# Patient Record
Sex: Female | Born: 1940
Health system: Southern US, Community
[De-identification: ages and names within clinical notes are randomized; demographics above are authoritative.]

## PROBLEM LIST (undated history)

## (undated) DIAGNOSIS — G4700 Insomnia, unspecified: Secondary | ICD-10-CM

## (undated) DIAGNOSIS — C50411 Malignant neoplasm of upper-outer quadrant of right female breast: Secondary | ICD-10-CM

## (undated) DIAGNOSIS — I5181 Takotsubo syndrome: Secondary | ICD-10-CM

## (undated) DIAGNOSIS — I219 Acute myocardial infarction, unspecified: Secondary | ICD-10-CM

## (undated) DIAGNOSIS — N952 Postmenopausal atrophic vaginitis: Secondary | ICD-10-CM

## (undated) DIAGNOSIS — K5790 Diverticulosis of intestine, part unspecified, without perforation or abscess without bleeding: Secondary | ICD-10-CM

## (undated) DIAGNOSIS — Z87442 Personal history of urinary calculi: Secondary | ICD-10-CM

## (undated) DIAGNOSIS — I10 Essential (primary) hypertension: Secondary | ICD-10-CM

## (undated) DIAGNOSIS — A029 Salmonella infection, unspecified: Secondary | ICD-10-CM

## (undated) DIAGNOSIS — C801 Malignant (primary) neoplasm, unspecified: Secondary | ICD-10-CM

## (undated) DIAGNOSIS — I249 Acute ischemic heart disease, unspecified: Secondary | ICD-10-CM

## (undated) HISTORY — DX: Postmenopausal atrophic vaginitis: N95.2

## (undated) HISTORY — DX: Takotsubo syndrome: I51.81

## (undated) HISTORY — DX: Malignant neoplasm of upper-outer quadrant of right female breast: C50.411

## (undated) HISTORY — DX: Diverticulosis of intestine, part unspecified, without perforation or abscess without bleeding: K57.90

## (undated) HISTORY — DX: Salmonella infection, unspecified: A02.9

## (undated) HISTORY — PX: COLONOSCOPY: SHX174

## (undated) HISTORY — PX: POLYPECTOMY: SHX149

## (undated) HISTORY — DX: Insomnia, unspecified: G47.00

## (undated) HISTORY — DX: Acute ischemic heart disease, unspecified: I24.9

## (undated) HISTORY — DX: Malignant (primary) neoplasm, unspecified: C80.1

---

## 1988-08-22 HISTORY — PX: DILATION AND CURETTAGE OF UTERUS: SHX78

## 1991-08-23 HISTORY — PX: CHOLECYSTECTOMY: SHX55

## 1998-03-17 ENCOUNTER — Other Ambulatory Visit: Admission: RE | Admit: 1998-03-17 | Discharge: 1998-03-17 | Payer: Self-pay | Admitting: Obstetrics and Gynecology

## 1998-06-23 ENCOUNTER — Other Ambulatory Visit: Admission: RE | Admit: 1998-06-23 | Discharge: 1998-06-23 | Payer: Self-pay | Admitting: Obstetrics and Gynecology

## 1998-07-22 ENCOUNTER — Ambulatory Visit (HOSPITAL_COMMUNITY): Admission: RE | Admit: 1998-07-22 | Discharge: 1998-07-22 | Payer: Self-pay | Admitting: Obstetrics and Gynecology

## 1998-07-22 ENCOUNTER — Encounter: Payer: Self-pay | Admitting: Obstetrics and Gynecology

## 1999-07-14 ENCOUNTER — Other Ambulatory Visit: Admission: RE | Admit: 1999-07-14 | Discharge: 1999-07-14 | Payer: Self-pay | Admitting: Obstetrics and Gynecology

## 1999-08-26 ENCOUNTER — Ambulatory Visit (HOSPITAL_COMMUNITY): Admission: RE | Admit: 1999-08-26 | Discharge: 1999-08-26 | Payer: Self-pay | Admitting: Obstetrics and Gynecology

## 1999-08-26 ENCOUNTER — Encounter: Payer: Self-pay | Admitting: Obstetrics and Gynecology

## 2000-07-18 ENCOUNTER — Other Ambulatory Visit: Admission: RE | Admit: 2000-07-18 | Discharge: 2000-07-18 | Payer: Self-pay | Admitting: Obstetrics and Gynecology

## 2000-08-28 ENCOUNTER — Encounter: Admission: RE | Admit: 2000-08-28 | Discharge: 2000-08-28 | Payer: Self-pay | Admitting: Obstetrics and Gynecology

## 2000-08-28 ENCOUNTER — Encounter: Payer: Self-pay | Admitting: Obstetrics and Gynecology

## 2001-05-08 ENCOUNTER — Ambulatory Visit (HOSPITAL_COMMUNITY): Admission: RE | Admit: 2001-05-08 | Discharge: 2001-05-08 | Payer: Self-pay | Admitting: Internal Medicine

## 2001-05-08 ENCOUNTER — Encounter: Payer: Self-pay | Admitting: Internal Medicine

## 2001-09-06 ENCOUNTER — Other Ambulatory Visit: Admission: RE | Admit: 2001-09-06 | Discharge: 2001-09-06 | Payer: Self-pay | Admitting: Obstetrics and Gynecology

## 2001-09-18 ENCOUNTER — Encounter: Admission: RE | Admit: 2001-09-18 | Discharge: 2001-09-18 | Payer: Self-pay | Admitting: Obstetrics and Gynecology

## 2001-09-18 ENCOUNTER — Encounter: Payer: Self-pay | Admitting: Obstetrics and Gynecology

## 2002-03-14 ENCOUNTER — Emergency Department (HOSPITAL_COMMUNITY): Admission: EM | Admit: 2002-03-14 | Discharge: 2002-03-14 | Payer: Self-pay | Admitting: Emergency Medicine

## 2002-03-17 ENCOUNTER — Emergency Department (HOSPITAL_COMMUNITY): Admission: EM | Admit: 2002-03-17 | Discharge: 2002-03-17 | Payer: Self-pay | Admitting: *Deleted

## 2002-03-21 ENCOUNTER — Encounter (HOSPITAL_COMMUNITY): Admission: RE | Admit: 2002-03-21 | Discharge: 2002-06-19 | Payer: Self-pay | Admitting: *Deleted

## 2002-09-23 ENCOUNTER — Encounter: Admission: RE | Admit: 2002-09-23 | Discharge: 2002-09-23 | Payer: Self-pay | Admitting: Obstetrics and Gynecology

## 2002-09-23 ENCOUNTER — Encounter: Payer: Self-pay | Admitting: Obstetrics and Gynecology

## 2002-10-14 ENCOUNTER — Other Ambulatory Visit: Admission: RE | Admit: 2002-10-14 | Discharge: 2002-10-14 | Payer: Self-pay | Admitting: Obstetrics and Gynecology

## 2003-08-23 HISTORY — PX: OTHER SURGICAL HISTORY: SHX169

## 2003-10-07 ENCOUNTER — Encounter: Admission: RE | Admit: 2003-10-07 | Discharge: 2003-10-07 | Payer: Self-pay | Admitting: Obstetrics and Gynecology

## 2003-10-28 ENCOUNTER — Other Ambulatory Visit: Admission: RE | Admit: 2003-10-28 | Discharge: 2003-10-28 | Payer: Self-pay | Admitting: Obstetrics and Gynecology

## 2004-10-19 ENCOUNTER — Encounter: Admission: RE | Admit: 2004-10-19 | Discharge: 2004-10-19 | Payer: Self-pay | Admitting: Obstetrics and Gynecology

## 2004-11-02 ENCOUNTER — Other Ambulatory Visit: Admission: RE | Admit: 2004-11-02 | Discharge: 2004-11-02 | Payer: Self-pay | Admitting: Obstetrics and Gynecology

## 2005-10-20 ENCOUNTER — Encounter: Admission: RE | Admit: 2005-10-20 | Discharge: 2005-10-20 | Payer: Self-pay | Admitting: Obstetrics and Gynecology

## 2005-12-07 ENCOUNTER — Other Ambulatory Visit: Admission: RE | Admit: 2005-12-07 | Discharge: 2005-12-07 | Payer: Self-pay | Admitting: Obstetrics and Gynecology

## 2006-12-13 ENCOUNTER — Other Ambulatory Visit: Admission: RE | Admit: 2006-12-13 | Discharge: 2006-12-13 | Payer: Self-pay | Admitting: Obstetrics and Gynecology

## 2007-01-04 ENCOUNTER — Ambulatory Visit: Payer: Self-pay | Admitting: Internal Medicine

## 2007-01-04 DIAGNOSIS — E785 Hyperlipidemia, unspecified: Secondary | ICD-10-CM | POA: Insufficient documentation

## 2007-09-25 ENCOUNTER — Ambulatory Visit: Payer: Self-pay | Admitting: Internal Medicine

## 2007-10-09 ENCOUNTER — Encounter: Payer: Self-pay | Admitting: Internal Medicine

## 2007-10-09 ENCOUNTER — Ambulatory Visit: Payer: Self-pay | Admitting: Internal Medicine

## 2007-11-21 ENCOUNTER — Telehealth: Payer: Self-pay | Admitting: Internal Medicine

## 2007-11-26 ENCOUNTER — Encounter: Payer: Self-pay | Admitting: Internal Medicine

## 2007-12-27 ENCOUNTER — Other Ambulatory Visit: Admission: RE | Admit: 2007-12-27 | Discharge: 2007-12-27 | Payer: Self-pay | Admitting: Obstetrics and Gynecology

## 2007-12-28 LAB — CONVERTED CEMR LAB
HDL: 56 mg/dL
LDL Cholesterol: 164 mg/dL
Triglycerides: 142 mg/dL

## 2008-01-30 ENCOUNTER — Ambulatory Visit: Payer: Self-pay | Admitting: Internal Medicine

## 2008-04-29 ENCOUNTER — Encounter: Payer: Self-pay | Admitting: Internal Medicine

## 2008-08-22 HISTORY — PX: KIDNEY STONE SURGERY: SHX686

## 2008-08-26 ENCOUNTER — Encounter: Payer: Self-pay | Admitting: Internal Medicine

## 2008-12-09 ENCOUNTER — Encounter: Payer: Self-pay | Admitting: Internal Medicine

## 2008-12-30 ENCOUNTER — Encounter: Payer: Self-pay | Admitting: Obstetrics and Gynecology

## 2008-12-30 ENCOUNTER — Ambulatory Visit: Payer: Self-pay | Admitting: Obstetrics and Gynecology

## 2008-12-30 ENCOUNTER — Other Ambulatory Visit: Admission: RE | Admit: 2008-12-30 | Discharge: 2008-12-30 | Payer: Self-pay | Admitting: Obstetrics and Gynecology

## 2008-12-30 LAB — CONVERTED CEMR LAB
HDL: 68 mg/dL
LDL Cholesterol: 169 mg/dL

## 2009-01-28 ENCOUNTER — Ambulatory Visit: Payer: Self-pay | Admitting: Internal Medicine

## 2009-03-11 ENCOUNTER — Encounter: Payer: Self-pay | Admitting: Internal Medicine

## 2009-03-11 ENCOUNTER — Emergency Department (HOSPITAL_COMMUNITY): Admission: EM | Admit: 2009-03-11 | Discharge: 2009-03-11 | Payer: Self-pay | Admitting: Emergency Medicine

## 2009-03-27 ENCOUNTER — Encounter: Payer: Self-pay | Admitting: Internal Medicine

## 2009-04-13 ENCOUNTER — Encounter: Payer: Self-pay | Admitting: Internal Medicine

## 2009-04-24 ENCOUNTER — Emergency Department (HOSPITAL_COMMUNITY): Admission: EM | Admit: 2009-04-24 | Discharge: 2009-04-25 | Payer: Self-pay | Admitting: Emergency Medicine

## 2009-05-05 ENCOUNTER — Encounter: Payer: Self-pay | Admitting: Internal Medicine

## 2009-05-12 ENCOUNTER — Encounter: Payer: Self-pay | Admitting: Internal Medicine

## 2009-06-16 ENCOUNTER — Encounter: Payer: Self-pay | Admitting: Internal Medicine

## 2009-06-24 ENCOUNTER — Ambulatory Visit (HOSPITAL_BASED_OUTPATIENT_CLINIC_OR_DEPARTMENT_OTHER): Admission: RE | Admit: 2009-06-24 | Discharge: 2009-06-24 | Payer: Self-pay | Admitting: Urology

## 2009-07-10 ENCOUNTER — Encounter (INDEPENDENT_AMBULATORY_CARE_PROVIDER_SITE_OTHER): Payer: Self-pay | Admitting: *Deleted

## 2009-12-15 ENCOUNTER — Encounter: Payer: Self-pay | Admitting: Internal Medicine

## 2010-01-12 ENCOUNTER — Other Ambulatory Visit: Admission: RE | Admit: 2010-01-12 | Discharge: 2010-01-12 | Payer: Self-pay | Admitting: Obstetrics and Gynecology

## 2010-01-12 ENCOUNTER — Ambulatory Visit: Payer: Self-pay | Admitting: Obstetrics and Gynecology

## 2010-01-13 LAB — CONVERTED CEMR LAB
HDL: 56 mg/dL
Triglycerides: 89 mg/dL

## 2010-01-20 LAB — CONVERTED CEMR LAB: Pap Smear: NORMAL

## 2010-02-10 ENCOUNTER — Ambulatory Visit: Payer: Self-pay | Admitting: Internal Medicine

## 2010-02-10 DIAGNOSIS — Z87442 Personal history of urinary calculi: Secondary | ICD-10-CM | POA: Insufficient documentation

## 2010-09-21 NOTE — Assessment & Plan Note (Signed)
Summary: fu on chole/tetanus/njr   Vital Signs:  Patient profile:   70 year old female Height:      64.5 inches Weight:      129 pounds BMI:     21.88 Pulse rate:   72 / minute Pulse rhythm:   regular Resp:     12 per minute BP sitting:   144 / 86  (left arm) Cuff size:   regular  Vitals Entered By: Gladis Riffle, RN (February 10, 2010 11:23 AM) CC: FU cholesterol from dr Oletha Blend, CHF Management Is Patient Diabetic? No   CC:  FU cholesterol from dr Oletha Blend and CHF Management.  History of Present Illness:  Follow-Up Visit      This is a 70 year old woman who presents for Follow-up visit.  The patient denies chest pain and palpitations.  Since the last visit the patient notes no new problems or concerns.  The patient is not taking any medications and is  monitoring BP.  When questioned about possible medication side effects, the patient notes none.    All other systems reviewed and were negative    Preventive Screening-Counseling & Management  Alcohol-Tobacco     Alcohol drinks/day: <1     Smoking Status: never  Current Problems (verified): 1)  Hyperlipidemia  (ICD-272.4) 2)  Elevated Blood Pressure Without Diagnosis of Hypertension  (ICD-796.2) 3)  Osteopenia  (ICD-733.90)  Current Medications (verified): 1)  None  Allergies (verified): No Known Drug Allergies  Past History:  Past Medical History: Osteopenia Nephrolithiasis, hx of  Physical Exam  General:  Well-developed,well-nourished,in no acute distress; alert,appropriate and cooperative throughout examination Head:  normocephalic and atraumatic.   Eyes:  pupils equal and pupils round.   Ears:  R ear normal and L ear normal.   Neck:  No deformities, masses, or tenderness noted. Chest Wall:  No deformities, masses, or tenderness noted. Lungs:  normal respiratory effort and no intercostal retractions.   Heart:  normal rate and regular rhythm.   Abdomen:  Bowel sounds positive,abdomen soft and non-tender  without masses, organomegaly or hernias noted. Msk:  No deformity or scoliosis noted of thoracic or lumbar spine.     Impression & Recommendations:  Problem # 1:  HYPERLIPIDEMIA (ICD-272.4) she exercises regularly follows a good diet no need for treatment   HDL:68 (12/30/2008), 56 (12/28/2007)  LDL:169 (12/30/2008), 164 (12/28/2007)  Chol:263 (12/30/2008), 248 (12/28/2007)  Trig:129 (12/30/2008), 142 (12/28/2007)  Problem # 2:  ELEVATED BLOOD PRESSURE WITHOUT DIAGNOSIS OF HYPERTENSION (ICD-796.2)  home BPs: 120s/60s   BP today: 144/86 Prior BP: 162/90 (01/28/2009)  Labs Reviewed: Chol: 263 (01/13/2010)   HDL: 56 (01/13/2010)   LDL: 189 (01/13/2010)   TG: 89 (01/13/2010)  Instructed in low sodium diet (DASH Handout) and behavior modification.    Problem # 3:  NEPHROLITHIASIS, HX OF (ICD-V13.01) no recurrence  Other Orders: TD Toxoids IM 7 YR + (16109) Admin 1st Vaccine (60454)  CHF Assessment/Plan:      The patient's current weight is 129 pounds.  Her previous weight was 130 pounds.    Preventive Care Screening  Mammogram:    Date:  01/20/2010    Next Due:  01/2012    Results:  normal-pt's report   Pap Smear:    Date:  01/20/2010    Next Due:  01/2013    Results:  normal-pts report   Last Tetanus Booster:    Date:  02/10/2010    Results:  Td    Immunizations Administered:  Tetanus Vaccine:  Vaccine Type: Td    Site: right deltoid    Mfr: Sanofi Pasteur    Dose: 0.5 ml    Route: IM    Given by: Gladis Riffle, RN    Exp. Date: 09/23/2011    Lot #: [id number]    Preventive Care Screening  Mammogram:    Date:  01/20/2010    Next Due:  01/2012    Results:  normal-pt's report   Pap Smear:    Date:  01/20/2010    Next Due:  01/2013    Results:  normal-pts report   Last Tetanus Booster:    Date:  02/10/2010    Results:  Td

## 2010-10-20 ENCOUNTER — Emergency Department (HOSPITAL_COMMUNITY): Payer: Medicare Other

## 2010-10-20 ENCOUNTER — Inpatient Hospital Stay (HOSPITAL_COMMUNITY)
Admission: EM | Admit: 2010-10-20 | Discharge: 2010-10-22 | DRG: 287 | Disposition: A | Discharge status: Home / Self Care | Payer: Medicare Other | Attending: Cardiovascular Disease | Admitting: Cardiovascular Disease

## 2010-10-20 DIAGNOSIS — I5181 Takotsubo syndrome: Secondary | ICD-10-CM | POA: Insufficient documentation

## 2010-10-20 DIAGNOSIS — I4891 Unspecified atrial fibrillation: Secondary | ICD-10-CM | POA: Diagnosis present

## 2010-10-20 DIAGNOSIS — Z7982 Long term (current) use of aspirin: Secondary | ICD-10-CM

## 2010-10-20 DIAGNOSIS — R9431 Abnormal electrocardiogram [ECG] [EKG]: Secondary | ICD-10-CM

## 2010-10-20 DIAGNOSIS — F411 Generalized anxiety disorder: Secondary | ICD-10-CM | POA: Diagnosis present

## 2010-10-20 DIAGNOSIS — I249 Acute ischemic heart disease, unspecified: Secondary | ICD-10-CM

## 2010-10-20 DIAGNOSIS — G4700 Insomnia, unspecified: Secondary | ICD-10-CM | POA: Diagnosis present

## 2010-10-20 DIAGNOSIS — R7989 Other specified abnormal findings of blood chemistry: Secondary | ICD-10-CM

## 2010-10-20 DIAGNOSIS — I4892 Unspecified atrial flutter: Secondary | ICD-10-CM | POA: Diagnosis present

## 2010-10-20 HISTORY — DX: Acute ischemic heart disease, unspecified: I24.9

## 2010-10-20 LAB — PROTIME-INR: INR: 0.95 (ref 0.00–1.49)

## 2010-10-20 LAB — COMPREHENSIVE METABOLIC PANEL
AST: 35 U/L (ref 0–37)
Albumin: 4 g/dL (ref 3.5–5.2)
BUN: 9 mg/dL (ref 6–23)
Calcium: 9.1 mg/dL (ref 8.4–10.5)
Creatinine, Ser: 0.79 mg/dL (ref 0.4–1.2)
GFR calc Af Amer: >60 mL/min (ref >60)
Total Protein: 6.8 g/dL (ref 6.0–8.3)

## 2010-10-20 LAB — CBC
MCH: 32.2 pg (ref 26.0–34.0)
MCV: 95.4 fL (ref 78.0–100.0)
Platelets: 184 10*3/uL (ref 150–400)
RBC: 4.59 MIL/uL (ref 3.87–5.11)
RDW: 12 % (ref 11.5–15.5)

## 2010-10-20 LAB — CARDIAC PANEL(CRET KIN+CKTOT+MB+TROPI)
CK, MB: 9.5 ng/mL (ref 0.3–4.0)
Relative Index: INVALID (ref 0.0–2.5)
Total CK: 98 U/L (ref 7–177)
Troponin I: 1.26 ng/mL (ref 0.00–0.06)

## 2010-10-20 LAB — DIFFERENTIAL
Basophils Relative: 0 % (ref 0–1)
Eosinophils Absolute: 0 10*3/uL (ref 0.0–0.7)
Eosinophils Relative: 0 % (ref 0–5)
Lymphs Abs: 1.6 10*3/uL (ref 0.7–4.0)
Monocytes Relative: 7 % (ref 3–12)
Neutrophils Relative %: 80 % — ABNORMAL HIGH (ref 43–77)

## 2010-10-20 LAB — POCT CARDIAC MARKERS
Myoglobin, poc: 94.1 ng/mL (ref 12–200)
Troponin i, poc: 0.29 ng/mL (ref 0.00–0.09)

## 2010-10-20 LAB — BASIC METABOLIC PANEL
BUN: 7 mg/dL (ref 6–23)
Chloride: 106 mEq/L (ref 96–112)
GFR calc Af Amer: >60 mL/min (ref >60)
GFR calc non Af Amer: >60 mL/min (ref >60)
Potassium: 4.1 mEq/L (ref 3.5–5.1)
Sodium: 139 mEq/L (ref 135–145)

## 2010-10-20 LAB — APTT: aPTT: 29 seconds (ref 24–37)

## 2010-10-21 DIAGNOSIS — I5181 Takotsubo syndrome: Secondary | ICD-10-CM

## 2010-10-21 HISTORY — DX: Takotsubo syndrome: I51.81

## 2010-10-21 HISTORY — PX: CARDIAC CATHETERIZATION: SHX172

## 2010-10-21 LAB — BASIC METABOLIC PANEL
Calcium: 8.6 mg/dL (ref 8.4–10.5)
Creatinine, Ser: 0.9 mg/dL (ref 0.4–1.2)
GFR calc Af Amer: >60 mL/min (ref >60)
GFR calc non Af Amer: >60 mL/min (ref >60)
Sodium: 139 mEq/L (ref 135–145)

## 2010-10-21 LAB — CBC
Hemoglobin: 13.6 g/dL (ref 12.0–15.0)
MCH: 31.9 pg (ref 26.0–34.0)
MCHC: 33.2 g/dL (ref 30.0–36.0)
RDW: 12 % (ref 11.5–15.5)

## 2010-10-21 LAB — CARDIAC PANEL(CRET KIN+CKTOT+MB+TROPI)
Relative Index: INVALID (ref 0.0–2.5)
Total CK: 82 U/L (ref 7–177)

## 2010-10-21 LAB — HEPARIN LEVEL (UNFRACTIONATED): Heparin Unfractionated: 0.54 IU/mL (ref 0.30–0.70)

## 2010-10-21 NOTE — H&P (Signed)
Jocelyn Day, BELLANTONI                ACCOUNT NO.:  [id number]  MEDICAL RECORD NO.:  [id number]           PATIENT TYPE:  I  LOCATION:  2014                         FACILITY:  [facility]  PHYSICIAN:  Vesta Mixer, M.D. DATE OF BIRTH:  08-Apr-1941  DATE OF ADMISSION:  10/20/2010 DATE OF DISCHARGE:                             HISTORY & PHYSICAL   Richele Strand is a 70 year old female with no significant past medical history.  She presents today with left-sided shoulder pain, abnormal EKG, and abnormal troponin levels.  Ms. Jocelyn Day has been in relatively good health all of her life.  She had kidney stones in 2010.  She works out on a daily basis.  For the past several days, she has noticed some vague left-sided chest pain.  It did not keep her from doing any of her activities and in fact she has continued to exercise.  She developed some worsening left shoulder pain last night.  The pain lasted through the night.  She woke up at 4 a.m. and took some Tylenol PM.  This seemed to resolve the pain and she went back to sleep.  She presented to the ER later today when some coworkers convinced her to come to the ER because of the episodes of chest pain. She received nitroglycerin and the pain resolved.  She denies any real shortness of breath.  She denies any syncope or presyncope.  She did get a little orthostatic after receiving the nitroglycerin.  She denies any nausea or vomiting or diaphoresis.  CURRENT MEDICATIONS:  None.  ALLERGIES:  None.  PAST MEDICAL HISTORY:  History of renal stones in 2010.  SOCIAL HISTORY:  The patient is a nonsmoker.  She drinks alcohol rarely. She works at [employer].  FAMILY HISTORY:  Negative.  REVIEW OF SYSTEMS:  As noted in the HPI.  All other systems are negative.  PHYSICAL EXAMINATION:  GENERAL:  She is a middle-aged female in no acute distress.  She is alert and oriented x3.  Mood and affect are normal. VITAL SIGNS:  Heart rate 72,  blood pressure is 121/58. HEENT:  Exam reveals that her mucous membranes are moist.  Her sclerae are nonicteric. NECK:  Carotids are 2+ without bruits.  There is no JVD. LUNG:  Exam is clear. MUSCULOSKELETAL:  Back is nontender.  Her shoulders are nontender. HEART:  Regular rate, S1 and S2.  She has no gallops or rubs.  Her PMI is nondisplaced. ABDOMEN:  Exam reveals good bowel sounds and is nontender.  There is no hepatosplenomegaly. EXTREMITIES:  She has no clubbing, cyanosis, or edema.  Pulses are 2+. There are no palpable cords.  Her EKG reveals normal sinus rhythm.  She has T-wave inversions in the anterior and lateral leads.  This is new from her previous EKG in 2010.  LABORATORY DATA:  Her troponin level by point-of-care is 0.29.  The repeat troponin levels are pending.  Her sodium is 139, potassium is 3.4, chloride 103, BUN 26, creatinine is 0.79.  White blood cell count is 12.8 with a hemoglobin of 14.8.  Chest x-ray reveals clear lungs and  no active disease.  IMPRESSION:  Ms. Solivan presents with symptoms that are consistent with unstable angina.  Her shoulder pain and the abnormal EKG are quite worrisome.  We will collect cardiac enzymes.  We will place her on the cardiac catheterization schedule for heart catheterization with possible angioplasty.  We will continue with the heparin for tonight.  We will start her on nitroglycerin if she starts to have any other episodes of chest pain.  We will also replace her potassium.  All of her other medical problems remain stable.     Vesta Mixer, M.D.     PJN/MEDQ  D:  10/20/2010  T:  10/21/2010  Job:  [id number]  cc:   Valetta Mole. Swords, MD  Electronically Signed by Kristeen Miss M.D. on 10/21/2010 06:17:38 PM

## 2010-10-22 ENCOUNTER — Telehealth: Payer: Self-pay | Admitting: Internal Medicine

## 2010-10-22 DIAGNOSIS — I200 Unstable angina: Secondary | ICD-10-CM

## 2010-10-22 LAB — CBC
Hemoglobin: 12.3 g/dL (ref 12.0–15.0)
MCH: 31.3 pg (ref 26.0–34.0)
MCV: 95.2 fL (ref 78.0–100.0)
Platelets: 172 10*3/uL (ref 150–400)
RBC: 3.93 MIL/uL (ref 3.87–5.11)
WBC: 6.4 10*3/uL (ref 4.0–10.5)

## 2010-10-22 LAB — LIPID PANEL
Cholesterol: 169 mg/dL (ref 0–200)
HDL: 54 mg/dL (ref >39)
Total CHOL/HDL Ratio: 3.1 RATIO
Triglycerides: 106 mg/dL (ref <150)

## 2010-10-22 LAB — MAGNESIUM: Magnesium: 2.1 mg/dL (ref 1.5–2.5)

## 2010-10-22 NOTE — Telephone Encounter (Signed)
Cardiology called in meds for pt.

## 2010-10-22 NOTE — Telephone Encounter (Signed)
Ok to start citalopram 10 mg po qd  See me 2-4 weeks

## 2010-10-22 NOTE — Telephone Encounter (Signed)
Pt at [facility] for past  2 days for mild MI, pt will be d/c today on a beta blocker and ace inhibitor.  She has been experiencing anxiety due to husband's dx of esophageal cancer.  Hospitalist wanted [place] to refer w/ Dr Cato Mulligan to get pt started on a low dose of Lexapro, Celexa or what he feels would work best to get her through cardiac rehab.

## 2010-10-25 ENCOUNTER — Ambulatory Visit: Payer: Medicare Other | Admitting: Internal Medicine

## 2010-10-25 LAB — GLUCOSE, CAPILLARY: Glucose-Capillary: 66 mg/dL — ABNORMAL LOW (ref 70–99)

## 2010-10-28 NOTE — Discharge Summary (Signed)
[name], [name]                ACCOUNT NO.:  [id number]  MEDICAL RECORD NO.:  [id number]           PATIENT TYPE:  I  LOCATION:  2014                         FACILITY:  [facility]  PHYSICIAN:  Jocelyn Day, M.D. DATE OF BIRTH:  1941/07/02  DATE OF ADMISSION:  10/20/2010 DATE OF DISCHARGE:  10/22/2010                              DISCHARGE SUMMARY   PRIMARY CARDIOLOGIST:  Jocelyn Mixer, MD  PRIMARY CARE PROVIDER:  Valetta Day. Swords, MD  DISCHARGE DIAGNOSIS:  Acute coronary syndrome/Takotsubo cardiomyopathy.  SECONDARY DIAGNOSES: 1. History of nephrolithiasis in 2010. 2. Anxiety. 3. Insomnia.  ALLERGIES:  No known drug allergies.  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  EF was 30% with apical ballooning and dyskinesis consistent with Takotsubo cardiomyopathy.  HISTORY OF PRESENT ILLNESS:  A 70 year old female without prior cardiac history, who was in usual state of health until several days prior to admission which began to experience a vague left-sided chest pain.  She has been under a fair amount of stress over the past 6 months as she has been caring for her husband who suffers from esophageal cancer.  On the evening prior to admission, she had worsening left shoulder pain that persisted throughout the night and may or may have not been resolved or relieved with Tylenol.  Presented to the ED on the morning of February 29 after having additional chest pain at work.  In the ED, she was treated with nitroglycerin with complete resolution of pain.  She was noted to have a point-of-care troponin of 0.29.  EKG showed anterolateral T-wave inversion which was new from prior ECG in 2010. The patient was admitted for further management of acute coronary artery syndrome and presumptive non-ST-elevation MI.  HOSPITAL COURSE:  The patient eventually peaked cardiac enzymes with  a CK of 98, MB of 9.5, and troponin-I of 1.26.  In light of non-ST- elevation MI, the patient underwent diagnostic catheterization on March 1 showing normal coronary arteries with a myocardial bridge of the LAD. EF was 30% with apical ballooning and dyskinesis consistent with Takotsubo cardiomyopathy.  Postprocedure, the patient was placed on beta- blocker as well as ACE inhibitor therapy and has not had any additional chest pain.  She has been seen by Cardiac Rehab and has been ambulating without difficulty.  She will be discharged home later today in good condition.  DISCHARGE LABS:  Hemoglobin 12.3, hematocrit 37.4, WBC 6.4, and platelets 172.  Sodium 139, potassium 4.1, chloride 109, CO2 of 26, BUN 9, creatinine 0.9, glucose 92, total bilirubin 1.1, alkaline phosphatase 58, AST 35, ALT 17, total protein 6.9, albumin 4.0, calcium 8.6, magnesium 2.1, CK 79, MB 4.4, and troponin-I 0.19.  Total cholesterol 69, triglycerides 106, HDL 54, and LDL 94.  DISPOSITION:  The patient is being discharged to home today in good condition.  FOLLOWUP APPOINTMENTS:  The patient will follow up with Jocelyn Day, nurse practitioner and Dr. Elease Day on March 16, at 8:45 a.m.  She will also have a repeat basic metabolic panel at that time.  She will follow with Dr. Cato Day in the next 1-2 weeks.  DISCHARGE MEDICATIONS: 1. Aspirin 81 mg daily. 2. Alprazolam 0.25 mg b.i.d. p.r.n. 3. Carvedilol 6.25 mg b.i.d. 4. Lisinopril 2.5 mg daily. 5. Nitroglycerin 0.4 mg sublingual for chest pain. 6. Zolpidem 5 mg nightly p.r.n. 7. Calcium carbonate plus D 1 tablet b.i.d. 8. Fish oil 1 cap daily. 9. Multivitamin daily. 10.Tylenol 325 mg 1-2 tabs daily p.r.n.  OUTSTANDING LABORATORY STUDIES:  Followup basic metabolic panel.  DURATION OF DISCHARGE ENCOUNTER:  60 minutes including physician time.     Jocelyn Day, ANP   ______________________________ Jocelyn Day, M.D.    CB/MEDQ  D:   10/22/2010  T:  10/22/2010  Job:  [id number]  cc:   Jocelyn Day. Swords, MD  Electronically Signed by Jocelyn Day ANP on 10/26/2010 04:24:06 PM Electronically Signed by Kristeen Miss M.D. on 10/27/2010 07:03:25 AM

## 2010-11-05 ENCOUNTER — Encounter: Payer: Self-pay | Admitting: Nurse Practitioner

## 2010-11-05 ENCOUNTER — Ambulatory Visit (INDEPENDENT_AMBULATORY_CARE_PROVIDER_SITE_OTHER): Payer: Medicare Other | Admitting: Nurse Practitioner

## 2010-11-05 DIAGNOSIS — F411 Generalized anxiety disorder: Secondary | ICD-10-CM

## 2010-11-05 DIAGNOSIS — I5181 Takotsubo syndrome: Secondary | ICD-10-CM

## 2010-11-05 DIAGNOSIS — I249 Acute ischemic heart disease, unspecified: Secondary | ICD-10-CM

## 2010-11-05 DIAGNOSIS — I200 Unstable angina: Secondary | ICD-10-CM

## 2010-11-05 DIAGNOSIS — F419 Anxiety disorder, unspecified: Secondary | ICD-10-CM | POA: Insufficient documentation

## 2010-11-05 MED ORDER — NITROGLYCERIN 0.4 MG SL SUBL: 0.4000 mg | SUBLINGUAL_TABLET | SUBLINGUAL | Status: DC | PRN

## 2010-11-05 NOTE — Assessment & Plan Note (Addendum)
She is felt to be doing well. Her EF was 30% at the time of her cath. She is currently on a satisfactory CHF regimen. She will need an echo in 4 to 6 weeks. We will continue with her current meds. She knows to weigh each day and monitor. She may continue with her walking program. We have encouraged her to have NTG on hand, especially in light of her cardiac condition. Prescription is given today.

## 2010-11-05 NOTE — Assessment & Plan Note (Signed)
Will need an echo in 4 to 6 weeks. Will continue with her current CHF regimen. Hopefully her LV systolic function will recover.

## 2010-11-05 NOTE — Progress Notes (Signed)
Subjective:Jocelyn Day is seen back today for a post hospital visit. She is seen for Dr. Elease Hashimoto. She has done well since her discharge. She has had really no bouts of chest pain. She has had just a few "twinges". She is not short of breath. Her weight has been stable. She is tolerating her medicines without problems. She does note that the Lexapro is expensive and would like a generic. She did not get a prescription for her NTG.  She is also interested in switching to a new primary care physician. She has had no problems with her cath site (right arm).    Current Outpatient Prescriptions  Medication Sig Dispense Refill  . acetaminophen (TYLENOL) 325 MG tablet Take 650 mg by mouth every 6 (six) hours as needed.        . ALPRAZolam (XANAX) 0.25 MG tablet Take 0.25 mg by mouth 2 (two) times daily as needed.        [name] aspirin 81 MG tablet Take 81 mg by mouth daily.        . calcium carbonate 200 MG capsule Take 250 mg by mouth 2 (two) times daily with a meal.        . carvedilol (COREG) 6.25 MG tablet Take 6.25 mg by mouth 2 (two) times daily with a meal.        . escitalopram (LEXAPRO) 10 MG tablet Take 10 mg by mouth at bedtime.        [name] lisinopril (PRINIVIL,ZESTRIL) 2.5 MG tablet Take 2.5 mg by mouth daily.        . Multiple Vitamin (MULTIVITAMIN) capsule Take 1 capsule by mouth daily.          No Known Allergies  Patient Active Problem List  Diagnoses  . HYPERLIPIDEMIA  . OSTEOPENIA  . ELEVATED BLOOD PRESSURE WITHOUT DIAGNOSIS OF HYPERTENSION  . NEPHROLITHIASIS, HX OF    History  Smoking status  . Never Smoker   Smokeless tobacco  . Not on file    History  Alcohol Use No    Family History  Problem Relation Age of Onset  . Coronary artery disease Mother   . Coronary artery disease Father     Review of Systems: She is sleeping a little better. She is under a significant amount of stress with her husband's illness. She denies chest pain. She is not lightheaded or dizzy. She is  not short of breath. All other review of systems are negative.  Physical Exam: She is very pleasant and in no acute distress. She is thin. VS are noted. Skin is warm and dry. Color is normal. HEENT is unremarkable. Lungs are clear. Cardiac exam shows a regular rate and rhythm. Abdomen is soft. She has no peripheral edema. Gait and ROM are intact. She has no gross neurologic deficits.   Assessment / Plan:

## 2010-11-05 NOTE — Assessment & Plan Note (Addendum)
She has been started on low dose Lexapro with some improvement. We can certainly use a generic alternative (Celexa 20mg ).  It is not felt that this will be long term medication for her. She has had significant stress with her husband that has esophageal cancer.

## 2010-11-05 NOTE — Patient Instructions (Signed)
Continue to monitor your weight at home. Prescription for NTG is provided today. We can change the Lexapro to Celexa 20mg . Prescription is provided today. We would like to repeat an echo in about 4 weeks.  Follow up with Dr. Elease Hashimoto in 4 weeks to discuss. Please continue with your current meds.

## 2010-11-18 NOTE — Procedures (Signed)
  Jocelyn Day, QUAKENBUSH                ACCOUNT NO.:  [id number]  MEDICAL RECORD NO.:  [id number]           PATIENT TYPE:  I  LOCATION:  2014                         FACILITY:  [facility]  PHYSICIAN:  Bevelyn Buckles. Elvert Cumpton, MDDATE OF BIRTH:  04/02/1941  DATE OF PROCEDURE:  10/21/2010 DATE OF DISCHARGE:                           CARDIAC CATHETERIZATION   Ms. Jocelyn Day is a delightful 70 year old woman with no history of known coronary artery disease.  She has been under severe emotional stress due to her husband having esophageal cancer.  She was admitted with left shoulder pain and an abnormal EKG with anterolateral T-wave inversions. Troponin was positive at 1.26.  She is brought to the catheterization lab for diagnostic angiography.  PROCEDURES PERFORMED: 1. Selective coronary angiography. 2. Left heart catheterization. 3. Left ventriculogram.  DESCRIPTION OF PROCEDURE:  The risks and indications were explained. Consent was signed and placed on the chart.  The right wrist area was prepped and draped in routine sterile fashion.  A normal Allen's test was confirmed.  It was then anesthetized with 1% local lidocaine.  A 5- French arterial sheath was placed in the right radial artery using modified Seldinger technique.  Once the sheath was successfully placed, she received 3000 units of systemic heparin and 3 mg of intra-arterial verapamil.  Standard catheters including JR-4, JL-3.5, and a pigtail catheter were used.  All catheter exchanges made over wire.  There were no apparent complications.  Central aortic pressure 97/82 with a mean of 85.  LV pressure 105/7 and EDP of 14.  There is no aortic stenosis.  Left main was normal.  LAD was a long vessel wrapping the apex.  It gave off 3 diagonals. There were some mild tapering of the distal LAD, but no significant atherosclerosis.  Left circumflex gave off a ramus branch, small OM-1, and a posterolateral, it was angiographically  normal.  Right coronary artery was a moderate-sized dominant vessel, gave off a PDA and tiny posterolateral, and was angiographically normal.  Left ventriculogram done in the RAO position showed an EF of 30% with dyskinesis and apical ballooning of the distal ventricle consistent with tako-tsubo cardiomyopathy.  ASSESSMENT: 1. Normal coronary arteries. 2. Apical ballooning syndrome consistent with tako-tsubo     cardiomyopathy with an ejection fraction of 30%.  PLAN:  Treat with beta-blockers and ACE inhibitors as blood pressure tolerates.  Hopefully she will recover completely in the next few weeks.     Bevelyn Buckles. Emilene Roma, MD     DRB/MEDQ  D:  10/21/2010  T:  10/22/2010  Job:  [id number]  Electronically Signed by Arvilla Meres MD on 11/18/2010 06:44:27 PM

## 2010-11-26 LAB — URINALYSIS, ROUTINE W REFLEX MICROSCOPIC
Glucose, UA: NEGATIVE mg/dL
Protein, ur: NEGATIVE mg/dL
pH: 6.5 (ref 5.0–8.0)

## 2010-11-26 LAB — URINE MICROSCOPIC-ADD ON

## 2010-11-28 LAB — URINALYSIS, ROUTINE W REFLEX MICROSCOPIC
Ketones, ur: NEGATIVE mg/dL
Nitrite: NEGATIVE
Protein, ur: 30 mg/dL — AB
Urobilinogen, UA: 0.2 mg/dL (ref 0.0–1.0)

## 2010-11-28 LAB — CBC
HCT: 43.9 % (ref 36.0–46.0)
Hemoglobin: 14.9 g/dL (ref 12.0–15.0)
MCHC: 33.9 g/dL (ref 30.0–36.0)
MCV: 96.2 fL (ref 78.0–100.0)
Platelets: 282 10*3/uL (ref 150–400)
RDW: 12.2 % (ref 11.5–15.5)

## 2010-11-28 LAB — DIFFERENTIAL
Basophils Absolute: 0.1 10*3/uL (ref 0.0–0.1)
Basophils Relative: 1 % (ref 0–1)
Lymphocytes Relative: 33 % (ref 12–46)
Monocytes Relative: 7 % (ref 3–12)
Neutro Abs: 5.6 10*3/uL (ref 1.7–7.7)
Neutrophils Relative %: 59 % (ref 43–77)

## 2010-11-28 LAB — URINE MICROSCOPIC-ADD ON

## 2010-11-28 LAB — COMPREHENSIVE METABOLIC PANEL
Alkaline Phosphatase: 50 U/L (ref 39–117)
BUN: 8 mg/dL (ref 6–23)
Creatinine, Ser: 0.83 mg/dL (ref 0.4–1.2)
Glucose, Bld: 132 mg/dL — ABNORMAL HIGH (ref 70–99)
Potassium: 3.1 mEq/L — ABNORMAL LOW (ref 3.5–5.1)
Total Protein: 7.1 g/dL (ref 6.0–8.3)

## 2010-12-01 ENCOUNTER — Other Ambulatory Visit: Payer: Self-pay | Admitting: Dermatology

## 2010-12-03 ENCOUNTER — Telehealth: Payer: Self-pay | Admitting: *Deleted

## 2010-12-03 ENCOUNTER — Ambulatory Visit (HOSPITAL_COMMUNITY): Payer: Medicare Other | Attending: Cardiovascular Disease | Admitting: Radiology

## 2010-12-03 DIAGNOSIS — I509 Heart failure, unspecified: Secondary | ICD-10-CM | POA: Insufficient documentation

## 2010-12-03 DIAGNOSIS — I428 Other cardiomyopathies: Secondary | ICD-10-CM

## 2010-12-03 NOTE — Telephone Encounter (Signed)
Patient called with echo results. Pt verbalized understanding. Jodette Quilla Freeze RN  

## 2010-12-10 ENCOUNTER — Ambulatory Visit (INDEPENDENT_AMBULATORY_CARE_PROVIDER_SITE_OTHER): Payer: Medicare Other | Admitting: Cardiovascular Disease

## 2010-12-10 ENCOUNTER — Encounter: Payer: Self-pay | Admitting: Cardiovascular Disease

## 2010-12-10 VITALS — BP 122/88 | HR 68 | Ht 65.0 in | Wt 127.4 lb

## 2010-12-10 DIAGNOSIS — I10 Essential (primary) hypertension: Secondary | ICD-10-CM

## 2010-12-10 DIAGNOSIS — I5181 Takotsubo syndrome: Secondary | ICD-10-CM

## 2010-12-10 MED ORDER — LISINOPRIL 10 MG PO TABS: 10.0000 mg | ORAL_TABLET | Freq: Every day | ORAL | Status: DC

## 2010-12-10 NOTE — Assessment & Plan Note (Signed)
She is doing quite well cardiac standpoint. I will increase her lisinopril to 10 mg a day. Her blood pressures also little bit elevated. I would anticipate that she has a fairly full recovery.  I'll see her again in 6 months.

## 2010-12-10 NOTE — Progress Notes (Signed)
Jocelyn Day Date of Birth  04/16/1941 [facility] [address]     [address] [place], [place]  [address] [phone]  Fax  [phone]  History of Present Illness:  Jocelyn Day is a middle-aged female with the history of a recent hospitalization for myocardial infarction that was ultimately diagnosed as Takotsubo syndrome.  Her ejection fraction was 30% at the time of a heart catheterization. She's been on medical therapy and is feeling quite a bit better. Her last echocardiogram which was performed earlier this week reveals an ejection fraction of 45-50%. She still has some occasional episodes of vague chest pain but no chest pain similar to her presenting angina. She is able to do all her normal daily activities.  Current Outpatient Prescriptions on File Prior to Visit  Medication Sig Dispense Refill  . aspirin 81 MG tablet Take 81 mg by mouth daily.        . calcium carbonate 200 MG capsule Take 250 mg by mouth as needed.       . carvedilol (COREG) 6.25 MG tablet Take 6.25 mg by mouth 2 (two) times daily with a meal.        . nitroGLYCERIN (NITROSTAT) 0.4 MG SL tablet Place 1 tablet (0.4 mg total) under the tongue every 5 (five) minutes as needed for chest pain.  100 tablet  3  . DISCONTD: lisinopril (PRINIVIL,ZESTRIL) 2.5 MG tablet Take 2.5 mg by mouth daily.        [name] acetaminophen (TYLENOL) 325 MG tablet Take 650 mg by mouth every 6 (six) hours as needed.        . ALPRAZolam (XANAX) 0.25 MG tablet Take 0.25 mg by mouth 2 (two) times daily as needed.        [name] escitalopram (LEXAPRO) 10 MG tablet Take 10 mg by mouth at bedtime.        . Multiple Vitamin (MULTIVITAMIN) capsule Take 1 capsule by mouth daily.          No Known Allergies  Past Medical History  Diagnosis Date  . ACS (acute coronary syndrome) 10/20/2010    Normal coronaries per cath 2/12  . Takotsubo cardiomyopathy 10/20/2010    EF of 30% per cath 2/12  . Anxiety   . Calcium oxalate  renal stones     History reviewed. No pertinent past surgical history.  History  Smoking status  . Never Smoker   Smokeless tobacco  . Not on file    History  Alcohol Use No    Family History  Problem Relation Age of Onset  . Coronary artery disease Mother   . Coronary artery disease Father   . Heart disease Neg Hx     Reviw of Systems:  Reviewed in the HPI.  All other systems are negative.  Physical Exam: BP 122/88  Pulse 68  Ht 5\' 5"  (1.651 m)  Wt 127 lb 6.4 oz (57.788 kg)  BMI 21.20 kg/m2 The patient is alert and oriented x 3.  The mood and affect are normal.  The skin is warm and dry.  Color is normal.  The HEENT exam reveals that the sclera are nonicteric.  The mucous membranes are moist.  The carotids are 2+ without bruits.  There is no thyromegaly.  There is no JVD.  The lungs are clear.  The chest wall is non tender.  The heart exam reveals a regular rate with a normal S1 and S2.  There are no murmurs, gallops, or  rubs.  The PMI is not displaced.   Abdominal exam reveals good bowel sounds.  There is no guarding or rebound.  There is no hepatosplenomegaly or tenderness.  There are no masses.  Exam of the legs reveal no clubbing, cyanosis, or edema.  The legs are without rashes.  The distal pulses are intact.  Cranial nerves II - XII are intact.  Motor and sensory functions are intact.  The gait is normal.  ECG:  Assessment / Plan:

## 2010-12-10 NOTE — Assessment & Plan Note (Signed)
Her blood pressure is mildly elevated today. We will increase her lisinopril to 10 mg a day as noted above. This will also help with her remodeling from her recent myocardial infarction.

## 2011-01-05 ENCOUNTER — Telehealth: Payer: Self-pay | Admitting: Internal Medicine

## 2011-01-05 NOTE — Telephone Encounter (Signed)
Pt eye doctor would like pt to have esr and crp to rule out  ?block artery. Pt  Will bring order. Can I sch?

## 2011-01-05 NOTE — Telephone Encounter (Signed)
ok 

## 2011-01-06 NOTE — Telephone Encounter (Signed)
Pt is sch for labs on 01-10-2011 8.30am

## 2011-01-10 ENCOUNTER — Other Ambulatory Visit (INDEPENDENT_AMBULATORY_CARE_PROVIDER_SITE_OTHER): Payer: Medicare Other | Admitting: Internal Medicine

## 2011-01-10 ENCOUNTER — Telehealth: Payer: Self-pay | Admitting: *Deleted

## 2011-01-10 DIAGNOSIS — R51 Headache: Secondary | ICD-10-CM

## 2011-01-10 DIAGNOSIS — I10 Essential (primary) hypertension: Secondary | ICD-10-CM

## 2011-01-10 DIAGNOSIS — R42 Dizziness and giddiness: Secondary | ICD-10-CM

## 2011-01-10 LAB — BASIC METABOLIC PANEL
BUN: 11 mg/dL (ref 6–23)
CO2: 30 mEq/L (ref 19–32)
Chloride: 103 mEq/L (ref 96–112)
GFR: 66.91 mL/min (ref >60.00)
Glucose, Bld: 90 mg/dL (ref 70–99)
Potassium: 5.2 mEq/L — ABNORMAL HIGH (ref 3.5–5.1)
Sodium: 141 mEq/L (ref 135–145)

## 2011-01-10 NOTE — Telephone Encounter (Signed)
Results given to pt

## 2011-01-11 ENCOUNTER — Telehealth: Payer: Self-pay | Admitting: *Deleted

## 2011-01-11 NOTE — Telephone Encounter (Signed)
Pt would like lab results.  

## 2011-01-12 ENCOUNTER — Ambulatory Visit (INDEPENDENT_AMBULATORY_CARE_PROVIDER_SITE_OTHER): Payer: Medicare Other | Admitting: Internal Medicine

## 2011-01-12 ENCOUNTER — Encounter: Payer: Self-pay | Admitting: Internal Medicine

## 2011-01-12 DIAGNOSIS — R519 Headache, unspecified: Secondary | ICD-10-CM | POA: Insufficient documentation

## 2011-01-12 DIAGNOSIS — R51 Headache: Secondary | ICD-10-CM

## 2011-01-12 DIAGNOSIS — I10 Essential (primary) hypertension: Secondary | ICD-10-CM

## 2011-01-12 DIAGNOSIS — F419 Anxiety disorder, unspecified: Secondary | ICD-10-CM

## 2011-01-12 DIAGNOSIS — F411 Generalized anxiety disorder: Secondary | ICD-10-CM

## 2011-01-12 DIAGNOSIS — E785 Hyperlipidemia, unspecified: Secondary | ICD-10-CM

## 2011-01-12 MED ORDER — TRAMADOL HCL 50 MG PO TABS: 50.0000 mg | ORAL_TABLET | Freq: Four times a day (QID) | ORAL | Status: DC | PRN

## 2011-01-12 NOTE — Patient Instructions (Signed)
Try Excedrin Migraine as needed for headaches Brain MRI as discussed  Call or return to clinic prn if these symptoms worsen or fail to improve as anticipated.

## 2011-01-12 NOTE — Progress Notes (Signed)
  Subjective:    Patient ID: Jocelyn Day, female    DOB: 1940-10-06, 70 y.o.   MRN: [id number]  HPI  70 year old patient who is seen today for evaluation of headaches. These began approximately 5 weeks ago and started initially on the frontal sinus areas the also now involved the temporal areas. She has had a recent eye exam that was unremarkable. She also has had some laboratory studies including a sed rate and C-reactive protein that were normal. She denies any focal neurological symptoms. She does have a daughter who is a Garment/textile technologist in [place] who has encouraged the patient to have a brain MRI. Headaches are described as an aching sensation seemed occur worse in the morning. Severity is described as moderate to severe. No prior history of migraines. No history of allergic rhinitis and no significant allergy-related symptoms. She is on no new medications. She does have a history of anxiety which has probably improved with her husband is more stable health  Review of Systems  Constitutional: Negative.   HENT: Negative for hearing loss, congestion, sore throat, rhinorrhea, dental problem, sinus pressure and tinnitus.   Eyes: Negative for pain, discharge and visual disturbance.  Respiratory: Negative for cough and shortness of breath.   Cardiovascular: Negative for chest pain, palpitations and leg swelling.  Gastrointestinal: Negative for nausea, vomiting, abdominal pain, diarrhea, constipation, blood in stool and abdominal distention.  Genitourinary: Negative for dysuria, urgency, frequency, hematuria, flank pain, vaginal bleeding, vaginal discharge, difficulty urinating, vaginal pain and pelvic pain.  Musculoskeletal: Negative for joint swelling, arthralgias and gait problem.  Skin: Negative for rash.  Neurological: Positive for headaches. Negative for dizziness, syncope, speech difficulty, weakness and numbness.  Hematological: Negative for adenopathy.  Psychiatric/Behavioral: Negative for  behavioral problems, dysphoric mood and agitation. The patient is not nervous/anxious.        Objective:   Physical Exam  Constitutional: She is oriented to person, place, and time. She appears well-developed and well-nourished.  HENT:  Head: Normocephalic.  Right Ear: External ear normal.  Left Ear: External ear normal.  Mouth/Throat: Oropharynx is clear and moist.  Eyes: Conjunctivae and EOM are normal. Pupils are equal, round, and reactive to light.  Neck: Normal range of motion. Neck supple. No thyromegaly present.  Cardiovascular: Normal rate, regular rhythm, normal heart sounds and intact distal pulses.   Pulmonary/Chest: Effort normal and breath sounds normal.  Abdominal: Soft. Bowel sounds are normal. She exhibits no mass. There is no tenderness.  Musculoskeletal: Normal range of motion.  Lymphadenopathy:    She has no cervical adenopathy.  Neurological: She is alert and oriented to person, place, and time. She has normal reflexes. No cranial nerve deficit. Coordination normal.       Finger-nose testing normal No drift of the outstretched arms  Skin: Skin is warm and dry. No rash noted.  Psychiatric: She has a normal mood and affect. Her behavior is normal.          Assessment & Plan:    New onset  headaches. Laboratory screen has been unremarkable. The patient is fairly adamant about imaging studies. This was discussed and she is aware of the extremely low yield in this clinical scenario Hypertension well controlled -cardiomyopathy- stable

## 2011-01-21 ENCOUNTER — Telehealth: Payer: Self-pay | Admitting: Internal Medicine

## 2011-01-21 NOTE — Telephone Encounter (Signed)
Pt req to change pcps from Dr Swords to Dr Kwiatkowski. Pls advise if ok?  °

## 2011-01-21 NOTE — Telephone Encounter (Signed)
Results from may have been addressed and called to patient. Ok to give these results again

## 2011-01-21 NOTE — Telephone Encounter (Signed)
ok 

## 2011-01-24 ENCOUNTER — Encounter: Payer: Medicare Other | Admitting: Obstetrics and Gynecology

## 2011-01-25 NOTE — Telephone Encounter (Signed)
ok 

## 2011-01-27 NOTE — Telephone Encounter (Signed)
Lft vm for pt re: approved pcp change.

## 2011-02-02 ENCOUNTER — Encounter (INDEPENDENT_AMBULATORY_CARE_PROVIDER_SITE_OTHER): Payer: Medicare Other | Admitting: Obstetrics and Gynecology

## 2011-02-02 ENCOUNTER — Other Ambulatory Visit: Payer: Self-pay | Admitting: Obstetrics and Gynecology

## 2011-02-02 ENCOUNTER — Other Ambulatory Visit (HOSPITAL_COMMUNITY)
Admission: RE | Admit: 2011-02-02 | Discharge: 2011-02-02 | Disposition: A | Discharge status: Home / Self Care | Payer: Medicare Other | Source: Ambulatory Visit | Attending: Obstetrics and Gynecology | Admitting: Obstetrics and Gynecology

## 2011-02-02 DIAGNOSIS — M810 Age-related osteoporosis without current pathological fracture: Secondary | ICD-10-CM

## 2011-02-02 DIAGNOSIS — N952 Postmenopausal atrophic vaginitis: Secondary | ICD-10-CM

## 2011-02-02 DIAGNOSIS — R823 Hemoglobinuria: Secondary | ICD-10-CM

## 2011-02-02 DIAGNOSIS — Z124 Encounter for screening for malignant neoplasm of cervix: Secondary | ICD-10-CM | POA: Insufficient documentation

## 2011-02-02 DIAGNOSIS — N6009 Solitary cyst of unspecified breast: Secondary | ICD-10-CM

## 2011-02-04 ENCOUNTER — Other Ambulatory Visit: Payer: Medicare Other

## 2011-05-18 ENCOUNTER — Other Ambulatory Visit: Payer: Self-pay | Admitting: Nurse Practitioner

## 2011-05-19 NOTE — Telephone Encounter (Signed)
escribe medication per fax request  

## 2011-05-21 ENCOUNTER — Other Ambulatory Visit: Payer: Self-pay | Admitting: Cardiovascular Disease

## 2011-06-02 IMAGING — CT CT ABDOMEN W/O CM
1 of 2 series · 13 of 32 positions shown, 19 images · non-contrast
Comparison: 03/11/2009.

CT ABDOMEN

CLINICAL DATA: Left flank pain and hematuria.  No kidney stones.

CT ABDOMEN AND PELVIS WITHOUT CONTRAST
TECHNIQUE: Multidetector CT imaging of the abdomen and pelvis was
performed following the standard protocol without intravenous
contrast.

[Series 2: stone under 200# w/ prev · axial · 0.61mm/px · z∈[+1412,+1756]mm · 13 of 81 slices shown, 19 images]
[im 6/81  soft-tissue]
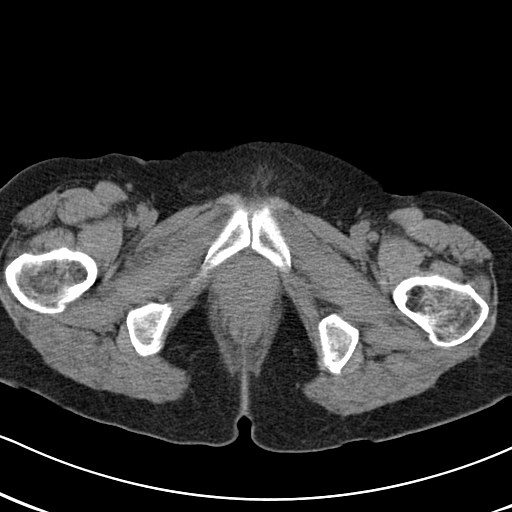
[im 6/81  bone]
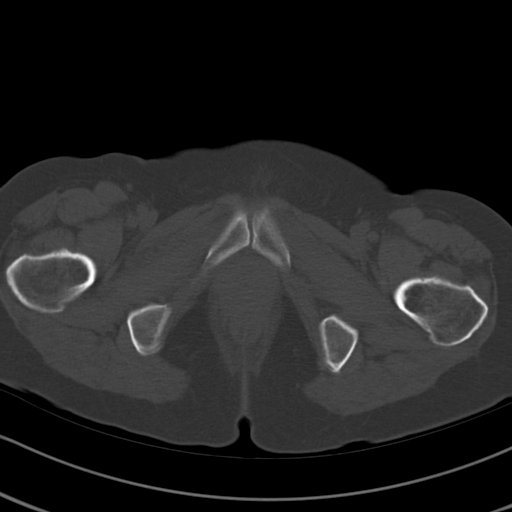
[im 11/81  soft-tissue]
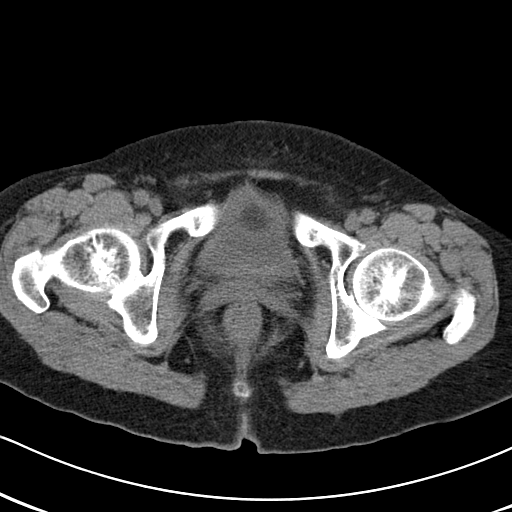
[im 17/81  soft-tissue]
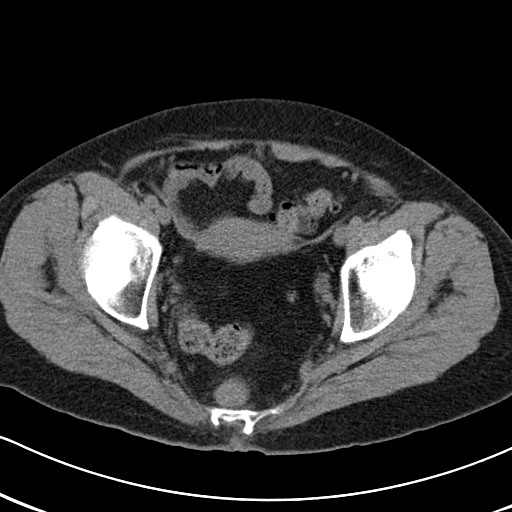
[im 22/81  soft-tissue]
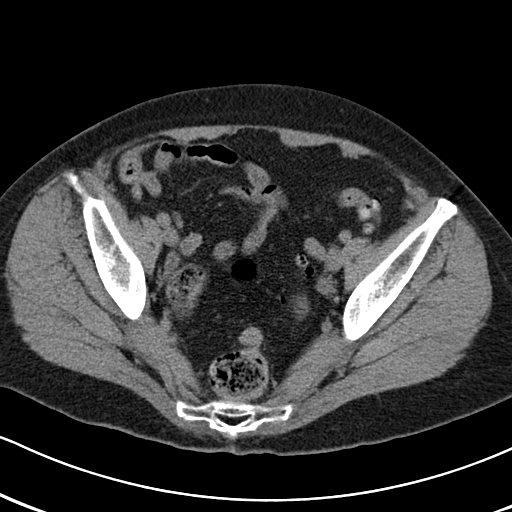
[im 27/81  soft-tissue]
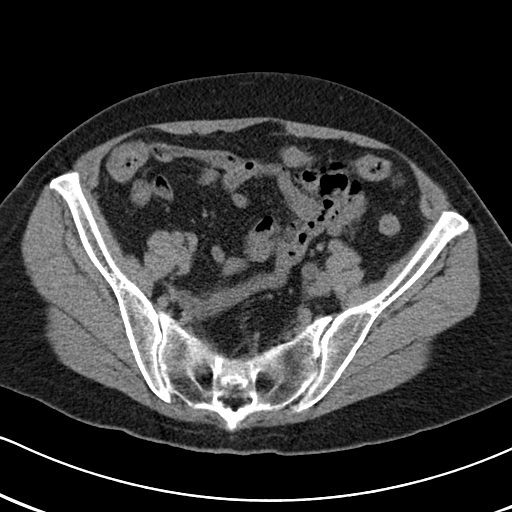
[im 33/81  soft-tissue]
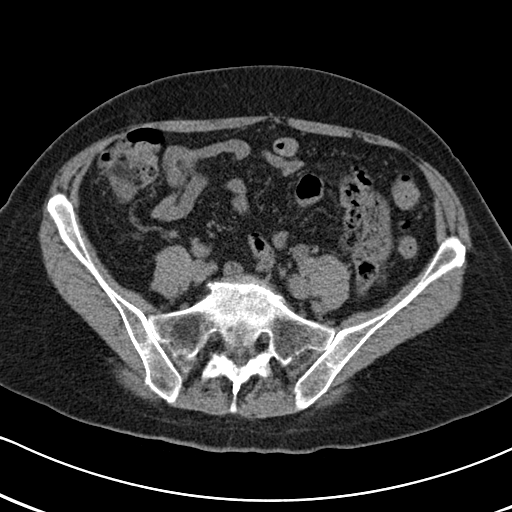
[im 43/81  soft-tissue]
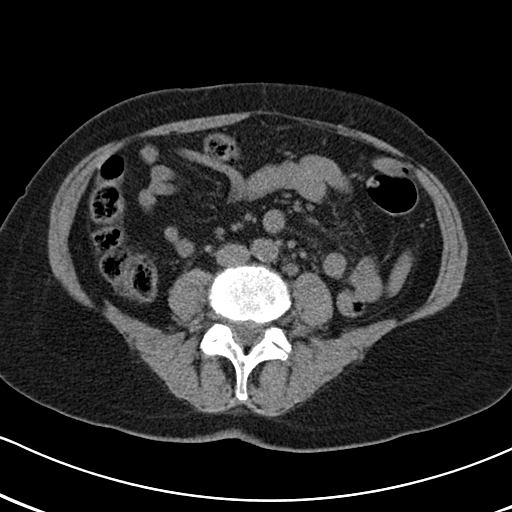
[im 49/81  soft-tissue]
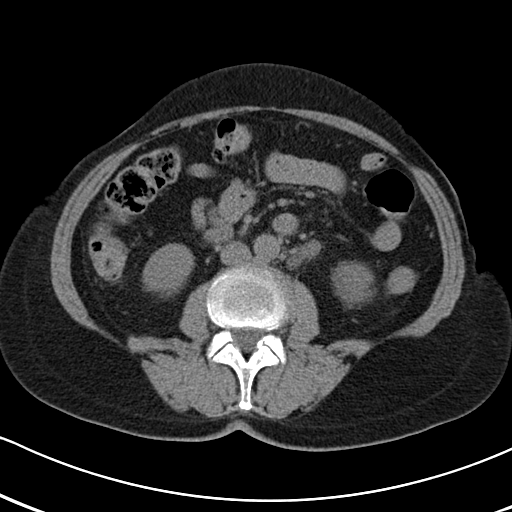
[im 54/81  soft-tissue]
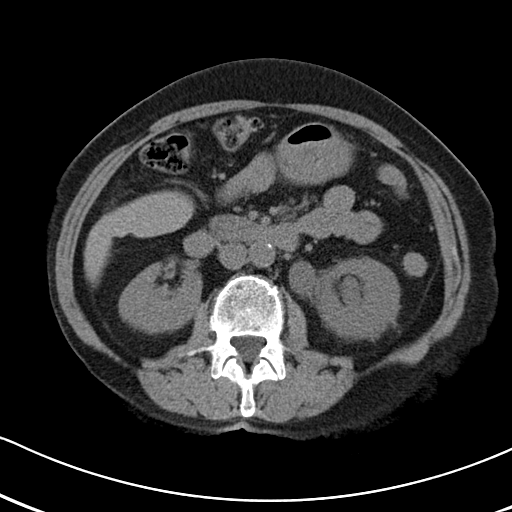
[im 54/81  bone]
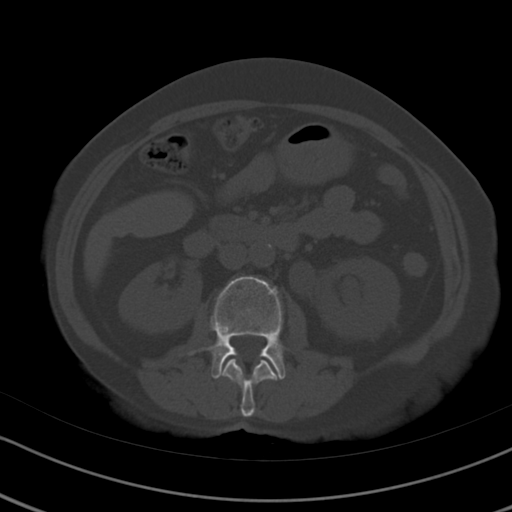
[im 59/81  soft-tissue]
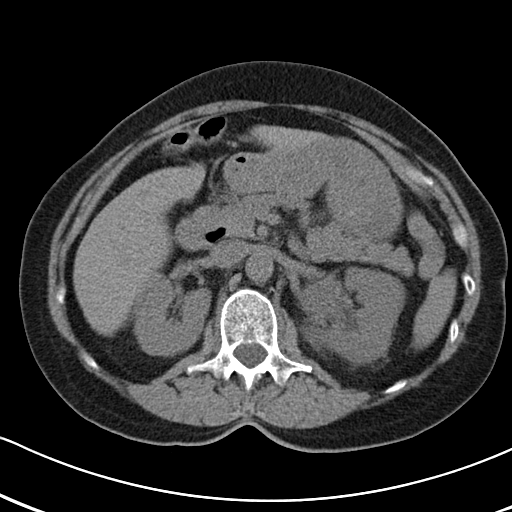
[im 59/81  lung]
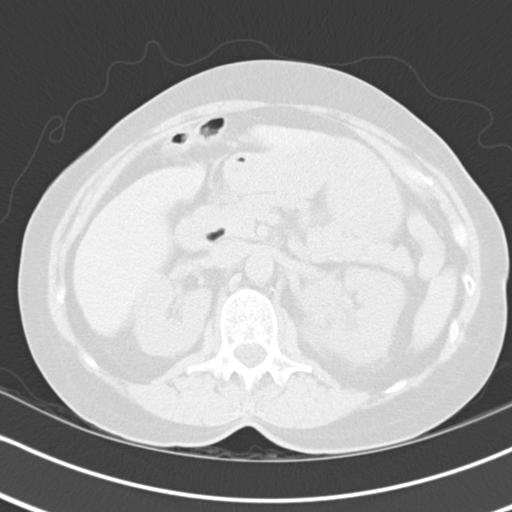
[im 65/81  soft-tissue]
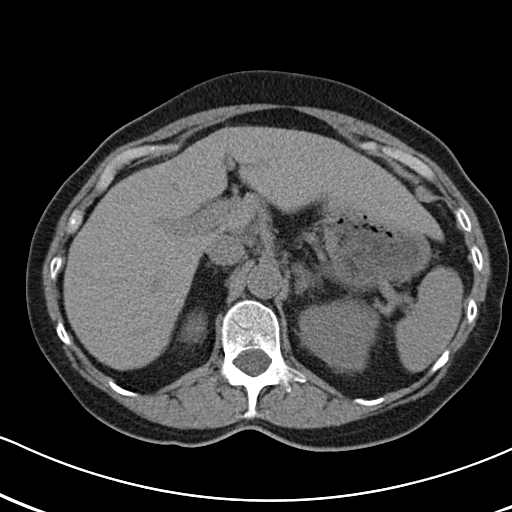
[im 65/81  lung]
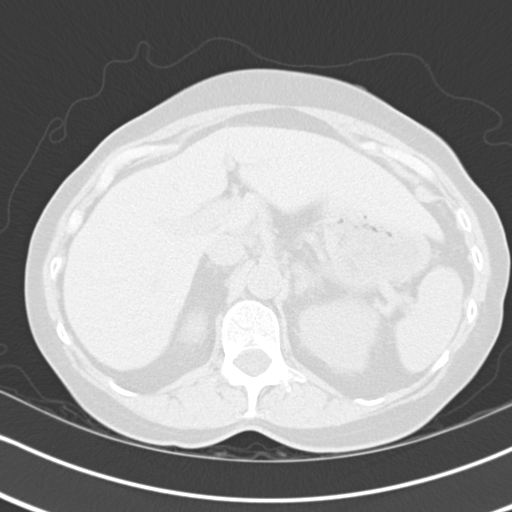
[im 70/81  soft-tissue]
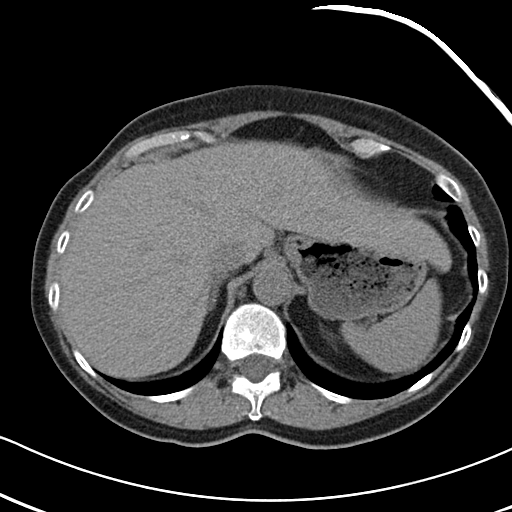
[im 70/81  lung]
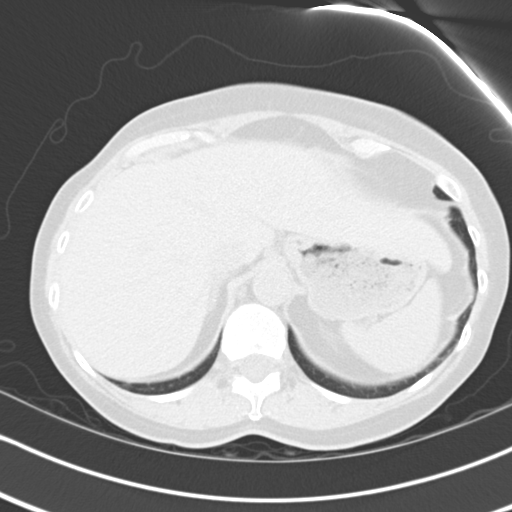
[im 75/81  soft-tissue]
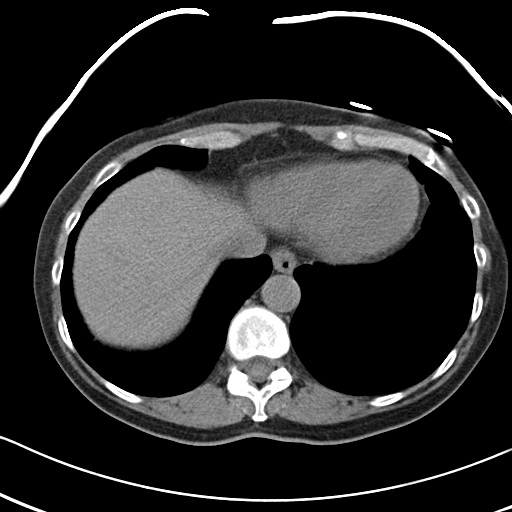
[im 75/81  lung]
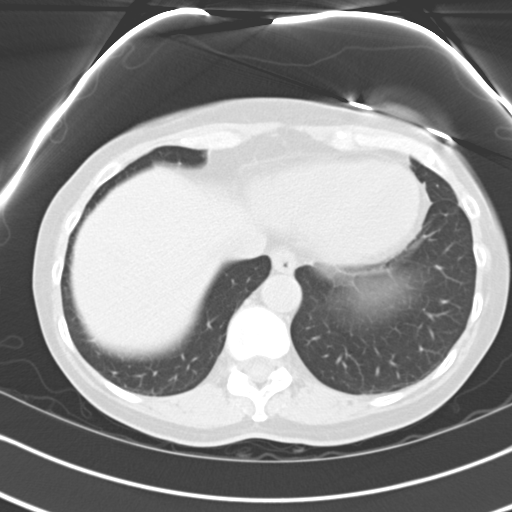

[13 of 32 positions shown; findings below may reference images not displayed]

FINDINGS: The liver and spleen have normal uninfused features.
Stomach is unremarkable.  Duodenal diverticulum noted.  Gallbladder
is surgically absent.  Pancreas is normal.  Bilateral adrenal
thickening is stable, without a discrete adrenal nodule.

No evidence for right renal calculus.  The left kidney is enlarged
and edematous with mild to moderate hydronephrosis.  There is
associated left hydroureter.

No abdominal aortic aneurysm.  No intraperitoneal free fluid.  The
abdominal bowel loops are unremarkable.
IMPRESSION: Secondary changes in the left kidney with mild to moderate left
hydroureteronephrosis.

CT PELVIS
FINDINGS: The dilated left ureter tracks into the pelvis.  4 mm
stone is seen in the distal left ureter about a centimeter proximal
to the UVJ.  This may well represent the same stone that was
visible in the proximal ureter on the previous study.

No stones are seen in the bladder.  There is no right ureteral
calculus.

The uterus is normal in appearance.  There is no adnexal mass.  No
intraperitoneal free fluid.  No pelvic lymphadenopathy.  Scattered
diverticuli are seen in the sigmoid colon without diverticulitis.
The terminal ileum is normal.  The appendix is normal.

Bone windows reveal no worrisome lytic or sclerotic osseous
lesions.
IMPRESSION: A distal left ureteral stone causes mild to moderate left
hydroureteronephrosis.  The stone has similar imaging
characteristics to the proximal ureteral stone seen on the previous
study and may reflect further migration of the stone down the
ureter.

## 2011-06-10 ENCOUNTER — Ambulatory Visit (INDEPENDENT_AMBULATORY_CARE_PROVIDER_SITE_OTHER): Payer: Medicare Other | Admitting: Cardiovascular Disease

## 2011-06-10 ENCOUNTER — Other Ambulatory Visit: Payer: Medicare Other | Admitting: *Deleted

## 2011-06-10 ENCOUNTER — Encounter: Payer: Self-pay | Admitting: Cardiovascular Disease

## 2011-06-10 DIAGNOSIS — I2510 Atherosclerotic heart disease of native coronary artery without angina pectoris: Secondary | ICD-10-CM

## 2011-06-10 DIAGNOSIS — I5181 Takotsubo syndrome: Secondary | ICD-10-CM

## 2011-06-10 DIAGNOSIS — E785 Hyperlipidemia, unspecified: Secondary | ICD-10-CM

## 2011-06-10 NOTE — Patient Instructions (Signed)
Your physician wants you to follow-up in: 6 months You will receive a reminder letter in the mail two months in advance. If you don't receive a letter, please call our office to schedule the follow-up appointment.  Your physician recommends that you return for a FASTING lipid profile: 6 months   Your physician recommends that you continue on your current medications as directed. Please refer to the Current Medication list given to you today.  

## 2011-06-10 NOTE — Assessment & Plan Note (Signed)
Parts is doing very well from a cardiac standpoint. She were is completely asymptomatic. We will continue with her current dose of lisinopril and carvedilol. I'm pleased that her left ventricular systolic function has normalized.

## 2011-06-10 NOTE — Progress Notes (Signed)
Jocelyn Day Date of Birth  28-Jul-1941 [facility] [address]    [address] [place], [place]  [address] [phone]  Fax  [phone]  History of Present Illness:  70 year old female with a history of coronary artery disease. She status post myocardial infarction related to Takosubo  Syndrome.  She's working out on a daily basis. GU walks outside works out on a treadmill every day.  Her initial ejection fraction was around 30%. It is now 50%.  She developed transient atrial fibrillation at the time of her myocardial infarction and has not had any further episodes of atrial fibrillation. She remains in sinus rhythm today.   Current Outpatient Prescriptions on File Prior to Visit  Medication Sig Dispense Refill  . acetaminophen (TYLENOL) 325 MG tablet Take 650 mg by mouth every 6 (six) hours as needed.        . ALPRAZolam (XANAX) 0.25 MG tablet Take 0.25 mg by mouth 2 (two) times daily as needed.        [name] aspirin 81 MG tablet Take 81 mg by mouth daily.        . Calcium Carb-Cholecalciferol (CALCIUM PLUS VITAMIN D3 PO) Take by mouth 2 (two) times daily.        . carvedilol (COREG) 6.25 MG tablet TAKE 1 TABLET BY MOUTH TWICE DAILY WITH MEALS  60 tablet  0  . citalopram (CELEXA) 20 MG tablet TAKE ONE TABLET BY MOUTH DAILY  30 tablet  1  . fish oil-omega-3 fatty acids 1000 MG capsule Take 1 g by mouth daily.        [name] lisinopril (PRINIVIL,ZESTRIL) 10 MG tablet Take 1 tablet (10 mg total) by mouth daily.  30 tablet  12  . Multiple Vitamin (MULTIVITAMIN) capsule Take 1 capsule by mouth daily.        . nitroGLYCERIN (NITROSTAT) 0.4 MG SL tablet Place 1 tablet (0.4 mg total) under the tongue every 5 (five) minutes as needed for chest pain.  100 tablet  3    No Known Allergies  Past Medical History  Diagnosis Date  . ACS (acute coronary syndrome) 10/20/2010    Normal coronaries per cath 2/12  . Takotsubo cardiomyopathy 10/20/2010    EF of 30% per cath 2/12  . Anxiety   . Calcium  oxalate renal stones   . Osteoporosis   . Atrophic vaginitis     Past Surgical History  Procedure Date  . Cholecystectomy 1993  . Cesarean section 1974  . Cesarean section 1977  . Dilation and curettage of uterus 1990  . Kidney stone surgery 2010    History  Smoking status  . Never Smoker   Smokeless tobacco  . Not on file    History  Alcohol Use No    Family History  Problem Relation Age of Onset  . Coronary artery disease Mother   . Hypertension Mother   . Coronary artery disease Father   . Cancer Father     prostate  . Heart disease Neg Hx   . Diabetes Brother     Reviw of Systems:  Reviewed in the HPI.  All other systems are negative.  Physical Exam: BP 136/86  Pulse 68  Ht 5\' 5"  (1.651 m)  Wt 128 lb 12.8 oz (58.423 kg)  BMI 21.43 kg/m2 The patient is alert and oriented x 3.  The mood and affect are normal.   Skin: warm and dry.  Color is normal.    HEENT:   Normal carotids. No  JVD.  Lungs: Clear to auscultation   Heart: Regular rate. She has no murmurs    Abdomen: Nontender. She has good bowel sounds.  Extremities:  No edema. There is no clubbing or cyanosis  Neuro:  Nonfocal. Her gait is normal.     ECG: Normal sinus rhythm. She has some nonspecific ST and T wave abnormality. The T wave inversion originally seen in leads V1 through V6 have resolved.  Assessment / Plan:

## 2011-06-14 ENCOUNTER — Telehealth: Payer: Self-pay | Admitting: Cardiovascular Disease

## 2011-06-14 MED ORDER — CARVEDILOL 6.25 MG PO TABS: ORAL_TABLET | ORAL | Status: DC

## 2011-06-14 NOTE — Telephone Encounter (Signed)
Pt calling re being under a lot of stress since this weekend some pressure in chest, lower left ventricle, wants to know if med needs to be changed?

## 2011-06-14 NOTE — Telephone Encounter (Signed)
Pt said she was having twinges in her chest since this weekend just like how she felt with her DX: takotsubo syndrome was diagnosed. bp 126/76, p 67, pt was just seen last week and pt said Dr Elease Hashimoto was considering a medication adjustment, pt felt it was her beta blocker. Pt denies SOB, nausea. Described as twinges in left ventricle area? She said she feels fine otherwise. Dr Elease Hashimoto was contacted, meds were adjusted, pt informed to use nitro and she was reminded of how to take and side effects of nitro. Pt told to call if pain doesn't resolve. Will need to be seen or go to ER. Pt verbalized understanding. Alfonso Ramus RN

## 2011-06-28 ENCOUNTER — Other Ambulatory Visit: Payer: Self-pay | Admitting: Cardiovascular Disease

## 2011-06-28 DIAGNOSIS — I5181 Takotsubo syndrome: Secondary | ICD-10-CM

## 2011-06-28 MED ORDER — CARVEDILOL 12.5 MG PO TABS: 12.5000 mg | ORAL_TABLET | Freq: Two times a day (BID) | ORAL | Status: DC

## 2011-06-28 NOTE — Telephone Encounter (Signed)
Pt was notified.  Rx was changed on computer.  She states she still has nitro and does not need Carvedilol refilled yet.  Appt scheduled for f/u.

## 2011-06-28 NOTE — Telephone Encounter (Signed)
Pt is complaining of the same discomfort she has been having since being diagnosed with takotsubo syndrome.  She states the discomfort is better since increasing the Carvedilol but she is still having.the discomfort.  She wants to know if increasing the carvedilol or celexa again would help?  She is currently taking Lisinopril 10mg , Celexa 20mg  and Carvedilol 9.375mg  bid.

## 2011-06-28 NOTE — Telephone Encounter (Signed)
Please refill NTG 0.4 mg SL prn

## 2011-06-28 NOTE — Telephone Encounter (Signed)
May increase the Coreg to 12.5 mg BID. Needs to monitor her blood pressure. I do not think increasing the Celexa would be appropriate unless she is having more psych related issues. Will need a follow up appointment to reassess. She is to call or go to the ER for any worsening of symptoms. Make sure she has NTG on hand.

## 2011-06-28 NOTE — Telephone Encounter (Signed)
Pt beta blockers have been increased and they are not working like they should she is still in pain

## 2011-06-30 ENCOUNTER — Other Ambulatory Visit: Payer: Self-pay | Admitting: *Deleted

## 2011-06-30 DIAGNOSIS — N6489 Other specified disorders of breast: Secondary | ICD-10-CM

## 2011-07-01 ENCOUNTER — Other Ambulatory Visit: Payer: Self-pay | Admitting: Obstetrics and Gynecology

## 2011-07-01 ENCOUNTER — Encounter: Payer: Self-pay | Admitting: Internal Medicine

## 2011-07-01 ENCOUNTER — Ambulatory Visit (INDEPENDENT_AMBULATORY_CARE_PROVIDER_SITE_OTHER): Payer: Medicare Other | Admitting: Internal Medicine

## 2011-07-01 DIAGNOSIS — N6489 Other specified disorders of breast: Secondary | ICD-10-CM

## 2011-07-01 DIAGNOSIS — I5181 Takotsubo syndrome: Secondary | ICD-10-CM

## 2011-07-01 DIAGNOSIS — F419 Anxiety disorder, unspecified: Secondary | ICD-10-CM

## 2011-07-01 DIAGNOSIS — F411 Generalized anxiety disorder: Secondary | ICD-10-CM

## 2011-07-01 DIAGNOSIS — I10 Essential (primary) hypertension: Secondary | ICD-10-CM

## 2011-07-01 MED ORDER — CITALOPRAM HYDROBROMIDE 40 MG PO TABS: 40.0000 mg | ORAL_TABLET | Freq: Every day | ORAL | Status: DC

## 2011-07-01 NOTE — Progress Notes (Signed)
  Subjective:    Patient ID: Jocelyn Day, female    DOB: [birth year], 70 y.o.   MRN: [id number]  HPI   70 year old patient who is seen today for followup. She is followed closely by cardiology following an acute MI related to Takotsubo cardiomyopathy. She seems to doing quite well. She has a history of hypertension not controlled on beta blocker therapy. She has a history of anxiety and is requesting a dose titration of her Celexa. She remains on lisinopril.  Past Medical History  Diagnosis Date  . ACS (acute coronary syndrome) 10/20/2010    Normal coronaries per cath 2/12  . Takotsubo cardiomyopathy 10/20/2010    EF of 30% per cath 2/12, EF is 50% as if 4/12  . Anxiety   . Calcium oxalate renal stones   . Osteoporosis   . Atrophic vaginitis    Past Surgical History  Procedure Date  . Cholecystectomy 1993  . Cesarean section 1974  . Cesarean section 1977  . Dilation and curettage of uterus 1990  . Kidney stone surgery 2010    reports that she has never smoked. She does not have any smokeless tobacco history on file. She reports that she does not drink alcohol or use illicit drugs. family history includes Cancer in her father; Coronary artery disease in her father and mother; Diabetes in her brother; and Hypertension in her mother.  There is no history of Heart disease. No Known Allergies   Review of Systems  Constitutional: Negative.   HENT: Negative for hearing loss, congestion, sore throat, rhinorrhea, dental problem, sinus pressure and tinnitus.   Eyes: Negative for pain, discharge and visual disturbance.  Respiratory: Negative for cough and shortness of breath.   Cardiovascular: Negative for chest pain, palpitations and leg swelling.  Gastrointestinal: Negative for nausea, vomiting, abdominal pain, diarrhea, constipation, blood in stool and abdominal distention.  Genitourinary: Negative for dysuria, urgency, frequency, hematuria, flank pain, vaginal bleeding, vaginal discharge,  difficulty urinating, vaginal pain and pelvic pain.  Musculoskeletal: Negative for joint swelling, arthralgias and gait problem.  Skin: Negative for rash.  Neurological: Negative for dizziness, syncope, speech difficulty, weakness, numbness and headaches.  Hematological: Negative for adenopathy.  Psychiatric/Behavioral: Negative for behavioral problems, dysphoric mood and agitation. The patient is not nervous/anxious.        Objective:   Physical Exam  Constitutional: She is oriented to person, place, and time. She appears well-developed and well-nourished.  HENT:  Head: Normocephalic.  Right Ear: External ear normal.  Left Ear: External ear normal.  Mouth/Throat: Oropharynx is clear and moist.  Eyes: Conjunctivae and EOM are normal. Pupils are equal, round, and reactive to light.  Neck: Normal range of motion. Neck supple. No thyromegaly present.  Cardiovascular: Normal rate, regular rhythm, normal heart sounds and intact distal pulses.   Pulmonary/Chest: Effort normal and breath sounds normal.  Abdominal: Soft. Bowel sounds are normal. She exhibits no mass. There is no tenderness.  Musculoskeletal: Normal range of motion.  Lymphadenopathy:    She has no cervical adenopathy.  Neurological: She is alert and oriented to person, place, and time.  Skin: Skin is warm and dry. No rash noted.  Psychiatric: She has a normal mood and affect. Her behavior is normal.          Assessment & Plan:     Hypertension-   Controlled  S/p MI- stable  Anx/depression-   Will titrate celexa

## 2011-07-01 NOTE — Patient Instructions (Signed)
It is important that you exercise regularly, at least 20 minutes 3 to 4 times per week.  If you develop chest pain or shortness of breath seek  medical attention.  Call or return to clinic prn if these symptoms worsen or fail to improve as anticipated.  Return in 4 months for follow-up  

## 2011-07-07 ENCOUNTER — Encounter: Payer: Self-pay | Admitting: *Deleted

## 2011-07-08 ENCOUNTER — Ambulatory Visit (INDEPENDENT_AMBULATORY_CARE_PROVIDER_SITE_OTHER): Payer: Medicare Other | Admitting: Cardiovascular Disease

## 2011-07-08 ENCOUNTER — Encounter: Payer: Self-pay | Admitting: Cardiovascular Disease

## 2011-07-08 VITALS — BP 106/71 | HR 70 | Ht 64.5 in | Wt 125.1 lb

## 2011-07-08 DIAGNOSIS — I5181 Takotsubo syndrome: Secondary | ICD-10-CM

## 2011-07-08 MED ORDER — PROPRANOLOL HCL 10 MG PO TABS: 10.0000 mg | ORAL_TABLET | Freq: Three times a day (TID) | ORAL | Status: DC

## 2011-07-08 MED ORDER — CARVEDILOL 12.5 MG PO TABS: 12.5000 mg | ORAL_TABLET | Freq: Two times a day (BID) | ORAL | Status: DC

## 2011-07-08 NOTE — Assessment & Plan Note (Signed)
She seems to be doing well on the higher dose of carvedilol. We'll continue with that dose of carvedilol. We discussed the possibility of adding propranolol 10 mg 4 times a day as needed. She thinks that she's doing well enough at this point that she will not  need extra propranolol.  I'll see her again in 3 months.

## 2011-07-08 NOTE — Patient Instructions (Signed)
Your physician recommends that you schedule a follow-up appointment in: 3 mo app made.  Your physician has recommended you make the following change in your medication:   1) Start propranolol 10 mg, use for palpitations as needed basis, take one tablet every 30 minutes up to 4 doses.

## 2011-07-08 NOTE — Progress Notes (Signed)
Jocelyn Day Date of Birth  Nov 07, 1940 [facility] [address]    [address] [place], [place]  [address] [phone]  Fax  [phone]  History of Present Illness:  70 year old female with a history of coronary artery disease. She status post myocardial infarction related to Takosubo  Syndrome.  She's working out on a daily basis. She walks outside works out on a treadmill every day.  Her initial ejection fraction was around 30%. It is now 50%.  She developed transient atrial fibrillation at the time of her myocardial infarction and has not had any further episodes of atrial fibrillation. She remains in sinus rhythm today.  Been under some increased stress recently. We increased her carvedilol at her last visit she seems to be feeling a bit better with that.   Current Outpatient Prescriptions on File Prior to Visit  Medication Sig Dispense Refill  . acetaminophen (TYLENOL) 325 MG tablet Take 650 mg by mouth every 6 (six) hours as needed.        . ALPRAZolam (XANAX) 0.25 MG tablet Take 0.25 mg by mouth 2 (two) times daily as needed.        [name] aspirin 81 MG tablet Take 81 mg by mouth daily.        . Calcium Carb-Cholecalciferol (CALCIUM PLUS VITAMIN D3 PO) Take by mouth 2 (two) times daily.        . carvedilol (COREG) 12.5 MG tablet Take 1 tablet (12.5 mg total) by mouth 2 (two) times daily with a meal.  60 tablet  5  . citalopram (CELEXA) 40 MG tablet Take 1 tablet (40 mg total) by mouth daily.  30 tablet  2  . fish oil-omega-3 fatty acids 1000 MG capsule Take 1 g by mouth daily.        . fluticasone (VERAMYST) 27.5 MCG/SPRAY nasal spray Place 2 sprays into the nose daily.        [name] lisinopril (PRINIVIL,ZESTRIL) 10 MG tablet Take 1 tablet (10 mg total) by mouth daily.  30 tablet  12  . Multiple Vitamin (MULTIVITAMIN) capsule Take 1 capsule by mouth daily.        . nitroGLYCERIN (NITROSTAT) 0.4 MG SL tablet Place 1 tablet (0.4 mg total) under the tongue every 5 (five) minutes as  needed for chest pain.  100 tablet  3    No Known Allergies  Past Medical History  Diagnosis Date  . ACS (acute coronary syndrome) 10/20/2010    Normal coronaries per cath 2/12  . Takotsubo cardiomyopathy 10/20/2010    EF of 30% per cath 2/12, EF is 50% as if 4/12  . Anxiety   . Calcium oxalate renal stones   . Osteoporosis   . Atrophic vaginitis   . Insomnia   . Takotsubo cardiomyopathy 10/21/2010    Acute coronary syndrome/Takotsubo cardiomyopathy    Past Surgical History  Procedure Date  . Cholecystectomy 1993  . Cesarean section 1974  . Cesarean section 1977  . Dilation and curettage of uterus 1990  . Kidney stone surgery 2010  . Cardiac catheterization 10/21/2010    Normal coronary arteries -- Apical ballooning syndrome consistent with tako-tsubo cardiomyopathy with an ejection fraction of 30%    History  Smoking status  . Never Smoker   Smokeless tobacco  . Not on file    History  Alcohol Use No    Family History  Problem Relation Age of Onset  . Coronary artery disease Mother   . Hypertension Mother   . Coronary  artery disease Father   . Cancer Father     prostate  . Heart disease Neg Hx   . Diabetes Brother     Reviw of Systems:  Reviewed in the HPI.  All other systems are negative.  Physical Exam: BP 106/71  Pulse 70  Ht 5' 4.5" (1.638 m)  Wt 125 lb 1.9 oz (56.754 kg)  BMI 21.15 kg/m2 The patient is alert and oriented x 3.  The mood and affect are normal.   Skin: warm and dry.  Color is normal.    HEENT:   Normal carotids. No JVD.  Lungs: Clear to auscultation   Heart: Regular rate. She has no murmurs    Abdomen: Nontender. She has good bowel sounds.  Extremities:  No edema. There is no clubbing or cyanosis  Neuro:  Nonfocal. Her gait is normal.    Assessment / Plan:

## 2011-07-20 ENCOUNTER — Other Ambulatory Visit: Payer: Self-pay | Admitting: Cardiovascular Disease

## 2011-07-20 ENCOUNTER — Telehealth: Payer: Self-pay | Admitting: Cardiovascular Disease

## 2011-07-20 DIAGNOSIS — I5181 Takotsubo syndrome: Secondary | ICD-10-CM

## 2011-07-20 MED ORDER — CARVEDILOL 12.5 MG PO TABS: ORAL_TABLET | ORAL | Status: DC

## 2011-07-20 NOTE — Telephone Encounter (Signed)
Pt call re discrepancy in dosage of carvedilol, thought it was changed to two tabs twice a day with mg increased to 12.5, called in was one and a half two times a day, which is what her med list shows, pls call pt to confirm correct dosage [phone]

## 2011-07-21 ENCOUNTER — Other Ambulatory Visit: Payer: Self-pay | Admitting: *Deleted

## 2011-07-21 DIAGNOSIS — I5181 Takotsubo syndrome: Secondary | ICD-10-CM

## 2011-07-21 MED ORDER — CARVEDILOL 12.5 MG PO TABS: ORAL_TABLET | ORAL | Status: DC

## 2011-07-21 NOTE — Telephone Encounter (Signed)
Medication clarification/ coreg 12.5 mg bid is how she is taking it.

## 2011-07-22 NOTE — Telephone Encounter (Signed)
Done on earlier Message

## 2011-10-01 ENCOUNTER — Other Ambulatory Visit: Payer: Self-pay | Admitting: Internal Medicine

## 2011-10-11 ENCOUNTER — Encounter: Payer: Self-pay | Admitting: Cardiovascular Disease

## 2011-10-11 ENCOUNTER — Ambulatory Visit (INDEPENDENT_AMBULATORY_CARE_PROVIDER_SITE_OTHER): Payer: Medicare Other | Admitting: Cardiovascular Disease

## 2011-10-11 VITALS — BP 111/74 | HR 73 | Ht 64.5 in | Wt 133.4 lb

## 2011-10-11 DIAGNOSIS — E785 Hyperlipidemia, unspecified: Secondary | ICD-10-CM

## 2011-10-11 DIAGNOSIS — I5181 Takotsubo syndrome: Secondary | ICD-10-CM

## 2011-10-11 NOTE — Assessment & Plan Note (Signed)
She is not having any chest pain.  Will see her again in August.  Will get a repeat echo the week before.

## 2011-10-11 NOTE — Patient Instructions (Signed)
Your physician recommends that you schedule a follow-up appointment in: 6 months with Dr Elease Hashimoto  Your physician has requested that you have an echocardiogram. Echocardiography is a painless test that uses sound waves to create images of your heart. It provides your doctor with information about the size and shape of your heart and how well your heart's chambers and valves are working. This procedure takes approximately one hour. There are no restrictions for this procedure.

## 2011-10-11 NOTE — Assessment & Plan Note (Signed)
Will check her fasting labs at her next office visit in 6 months.

## 2011-10-11 NOTE — Progress Notes (Signed)
Jocelyn Day Date of Birth  1940/09/09 [facility]     [place] Office  [address]    [address]   [address] [place], [place]  [address]    [place], [place]  [address] [phone]  Fax  [phone]  [phone]  Fax [phone]  Problem List: 1.  Takosubo Syndrome - Feb, 2012, EF = 30%, echo in April, 2012 revealed EF of 45-50%  History of Present Illness:  Jocelyn Day is a 71 yo with a hx of Takosubo Syndrome.  She has not had any recurrent chest pain.  Her husband died of esophageal cancer this past December. She is exercising some.     Current Outpatient Prescriptions on File Prior to Visit  Medication Sig Dispense Refill  . acetaminophen (TYLENOL) 325 MG tablet Take 650 mg by mouth every 6 (six) hours as needed.        . ALPRAZolam (XANAX) 0.25 MG tablet Take 0.25 mg by mouth 2 (two) times daily as needed.        [name] aspirin 81 MG tablet Take 81 mg by mouth daily.        . Calcium Carb-Cholecalciferol (CALCIUM PLUS VITAMIN D3 PO) Take by mouth 2 (two) times daily.        . carvedilol (COREG) 12.5 MG tablet One 12.5 mg tablet twice daily  180 tablet  5  . citalopram (CELEXA) 40 MG tablet TAKE 1 TABLET BY MOUTH DIALY  30 tablet  5  . fish oil-omega-3 fatty acids 1000 MG capsule Take 1 g by mouth daily.        [name] lisinopril (PRINIVIL,ZESTRIL) 10 MG tablet Take 1 tablet (10 mg total) by mouth daily.  30 tablet  12  . Multiple Vitamin (MULTIVITAMIN) capsule Take 1 capsule by mouth daily.        . nitroGLYCERIN (NITROSTAT) 0.4 MG SL tablet Place 1 tablet (0.4 mg total) under the tongue every 5 (five) minutes as needed for chest pain.  100 tablet  3    No Known Allergies  Past Medical History  Diagnosis Date  . ACS (acute coronary syndrome) 10/20/2010    Normal coronaries per cath 2/12  . Takotsubo cardiomyopathy 10/20/2010    EF of 30% per cath 2/12, EF is 50% as if 4/12  . Anxiety   . Calcium oxalate renal stones   . Osteoporosis   . Atrophic vaginitis   .  Insomnia   . Takotsubo cardiomyopathy 10/21/2010    Acute coronary syndrome/Takotsubo cardiomyopathy    Past Surgical History  Procedure Date  . Cholecystectomy 1993  . Cesarean section 1974  . Cesarean section 1977  . Dilation and curettage of uterus 1990  . Kidney stone surgery 2010  . Cardiac catheterization 10/21/2010    Normal coronary arteries -- Apical ballooning syndrome consistent with tako-tsubo cardiomyopathy with an ejection fraction of 30%    History  Smoking status  . Never Smoker   Smokeless tobacco  . Not on file    History  Alcohol Use No    Family History  Problem Relation Age of Onset  . Coronary artery disease Mother   . Hypertension Mother   . Coronary artery disease Father   . Cancer Father     prostate  . Heart disease Neg Hx   . Diabetes Brother     Reviw of Systems:  Reviewed in the HPI.  All other systems are negative.  Physical Exam: Blood pressure 111/74, pulse 73, height 5'  4.5" (1.638 m), weight 133 lb 6.4 oz (60.51 kg). General: Well developed, well nourished, in no acute distress.  Head: Normocephalic, atraumatic, sclera non-icteric, mucus membranes are moist,   Neck: Supple. Negative for carotid bruits. JVD not elevated.  Lungs: Clear bilaterally to auscultation without wheezes, rales, or rhonchi. Breathing is unlabored.  Heart: RRR with S1 S2. No murmurs, rubs, or gallops appreciated.  Abdomen: Soft, non-tender, non-distended with normoactive bowel sounds. No hepatomegaly. No rebound/guarding. No obvious abdominal masses.  Msk:  Strength and tone appear normal for age.  Extremities: No clubbing or cyanosis. No edema.  Distal pedal pulses are 2+ and equal bilaterally.  Neuro: Alert and oriented X 3. Moves all extremities spontaneously.  Psych:  Responds to questions appropriately with a normal affect.  ECG:  Assessment / Plan:

## 2011-12-09 ENCOUNTER — Other Ambulatory Visit (INDEPENDENT_AMBULATORY_CARE_PROVIDER_SITE_OTHER): Payer: Medicare Other

## 2011-12-09 DIAGNOSIS — E785 Hyperlipidemia, unspecified: Secondary | ICD-10-CM

## 2011-12-09 DIAGNOSIS — Z0000 Encounter for general adult medical examination without abnormal findings: Secondary | ICD-10-CM

## 2011-12-09 LAB — BASIC METABOLIC PANEL
BUN: 16 mg/dL (ref 6–23)
CO2: 30 mEq/L (ref 19–32)
Calcium: 9.5 mg/dL (ref 8.4–10.5)
Creatinine, Ser: 0.8 mg/dL (ref 0.4–1.2)
Glucose, Bld: 86 mg/dL (ref 70–99)

## 2011-12-09 LAB — CBC WITH DIFFERENTIAL/PLATELET
Basophils Absolute: 0 10*3/uL (ref 0.0–0.1)
Eosinophils Absolute: 0.1 10*3/uL (ref 0.0–0.7)
Lymphocytes Relative: 30.2 % (ref 12.0–46.0)
MCHC: 33.5 g/dL (ref 30.0–36.0)
MCV: 98.1 fl (ref 78.0–100.0)
Monocytes Absolute: 0.3 10*3/uL (ref 0.1–1.0)
Neutrophils Relative %: 62.2 % (ref 43.0–77.0)
Platelets: 198 10*3/uL (ref 150.0–400.0)
RDW: 12.4 % (ref 11.5–14.6)

## 2011-12-09 LAB — TSH: TSH: 2.71 u[IU]/mL (ref 0.35–5.50)

## 2011-12-09 LAB — POCT URINALYSIS DIPSTICK
Bilirubin, UA: NEGATIVE
Blood, UA: NEGATIVE
Ketones, UA: NEGATIVE
pH, UA: 8.5

## 2011-12-09 LAB — HEPATIC FUNCTION PANEL
Albumin: 4.5 g/dL (ref 3.5–5.2)
Alkaline Phosphatase: 58 U/L (ref 39–117)
Bilirubin, Direct: 0 mg/dL (ref 0.0–0.3)

## 2011-12-09 LAB — LIPID PANEL
HDL: 49.1 mg/dL (ref >39.00)
Triglycerides: 116 mg/dL (ref 0.0–149.0)
VLDL: 23.2 mg/dL (ref 0.0–40.0)

## 2011-12-13 ENCOUNTER — Other Ambulatory Visit: Payer: Self-pay | Admitting: Cardiovascular Disease

## 2011-12-19 ENCOUNTER — Encounter: Payer: Self-pay | Admitting: Internal Medicine

## 2011-12-19 ENCOUNTER — Ambulatory Visit (INDEPENDENT_AMBULATORY_CARE_PROVIDER_SITE_OTHER): Payer: Medicare Other | Admitting: Internal Medicine

## 2011-12-19 VITALS — BP 110/60 | HR 70 | Temp 98.1°F | Resp 16 | Ht 64.75 in | Wt 132.0 lb

## 2011-12-19 DIAGNOSIS — F411 Generalized anxiety disorder: Secondary | ICD-10-CM

## 2011-12-19 DIAGNOSIS — F419 Anxiety disorder, unspecified: Secondary | ICD-10-CM

## 2011-12-19 DIAGNOSIS — I249 Acute ischemic heart disease, unspecified: Secondary | ICD-10-CM

## 2011-12-19 DIAGNOSIS — Z0000 Encounter for general adult medical examination without abnormal findings: Secondary | ICD-10-CM

## 2011-12-19 DIAGNOSIS — I10 Essential (primary) hypertension: Secondary | ICD-10-CM

## 2011-12-19 DIAGNOSIS — I5181 Takotsubo syndrome: Secondary | ICD-10-CM

## 2011-12-19 DIAGNOSIS — R030 Elevated blood-pressure reading, without diagnosis of hypertension: Secondary | ICD-10-CM

## 2011-12-19 NOTE — Patient Instructions (Signed)
Limit your sodium (Salt) intake    It is important that you exercise regularly, at least 20 minutes 3 to 4 times per week.  If you develop chest pain or shortness of breath seek  medical attention.  Return in one year for follow-up  Cardiology follow-up as scheduled   

## 2011-12-19 NOTE — Progress Notes (Signed)
Subjective:    Patient ID: Jocelyn Day, female    DOB: 09/29/1940, 71 y.o.   MRN: [id number]  HPI  71 year old patient who is followed by cardiology for Takotsubp CM;  she has done well and her last ejection fraction was apparently 50%. She is scheduled for followup in August She has a history of anxiety depression and has been adjusting to the recent death of her husband in 09/14/23 from esophageal cancer. She has hypertension  1. Risk factors, based on past  M,S,F history- cardiovascular risk factors include hypertension and mild dyslipidemia. She has a history of acute coronary syndrome and has had a heart catheterization that was free of significant coronary artery disease  2.  Physical activities: No activity restrictions  3.  Depression/mood: History of anxiety depression and grief reaction from the death of her husband  4.  Hearing: No deficits  5.  ADL's: Independent in all aspects of daily living  6.  Fall risk: Low  7.  Home safety: No problems identified  8.  Height weight, and visual acuity; heart weight stable no change in visual acuity  9.  Counseling: Heart healthy diet regular exercise all encouraged  10. Lab orders based on risk factors: Laboratory profile reviewed 11. Referral : Followup cardiology  12. Care plan: Continue present regimen. We'll probably taper and discontinue Celexa at some point later this year  13. Cognitive assessment: Alert and oriented normal affect. No cognitive dysfunction.   Past Medical History  Diagnosis Date  . ACS (acute coronary syndrome) 10/20/2010    Normal coronaries per cath 2/12  . Takotsubo cardiomyopathy 10/20/2010    EF of 30% per cath 2/12, EF is 50% as if 4/12  . Anxiety   . Calcium oxalate renal stones   . Osteoporosis   . Atrophic vaginitis   . Insomnia   . Takotsubo cardiomyopathy 10/21/2010    Acute coronary syndrome/Takotsubo cardiomyopathy    History   Social History  . Marital Status: Married    Spouse  Name: N/A    Number of Children: N/A  . Years of Education: N/A   Occupational History  . Not on file.   Social History Main Topics  . Smoking status: Never Smoker   . Smokeless tobacco: Not on file  . Alcohol Use: No  . Drug Use: No  . Sexually Active: Not on file   Other Topics Concern  . Not on file   Social History Narrative  . No narrative on file    Past Surgical History  Procedure Date  . Cholecystectomy 1993  . Cesarean section 1974  . Cesarean section 1977  . Dilation and curettage of uterus 1990  . Kidney stone surgery 2010  . Cardiac catheterization 10/21/2010    Normal coronary arteries -- Apical ballooning syndrome consistent with tako-tsubo cardiomyopathy with an ejection fraction of 30%    Family History  Problem Relation Age of Onset  . Coronary artery disease Mother   . Hypertension Mother   . Coronary artery disease Father   . Cancer Father     prostate  . Heart disease Neg Hx   . Diabetes Brother     No Known Allergies  Current Outpatient Prescriptions on File Prior to Visit  Medication Sig Dispense Refill  . acetaminophen (TYLENOL) 325 MG tablet Take 650 mg by mouth every 6 (six) hours as needed.        . ALPRAZolam (XANAX) 0.25 MG tablet Take 0.25 mg by mouth 2 (two) times  daily as needed.        [name] aspirin 81 MG tablet Take 81 mg by mouth daily.        . Calcium Carb-Cholecalciferol (CALCIUM PLUS VITAMIN D3 PO) Take by mouth 2 (two) times daily.        . carvedilol (COREG) 12.5 MG tablet One 12.5 mg tablet twice daily  180 tablet  5  . citalopram (CELEXA) 40 MG tablet TAKE 1 TABLET BY MOUTH DIALY  30 tablet  5  . fish oil-omega-3 fatty acids 1000 MG capsule Take 1 g by mouth daily.        [name] lisinopril (PRINIVIL,ZESTRIL) 10 MG tablet TAKE 1 TABLET BY MOUTH DAILY  30 tablet  11  . Multiple Vitamin (MULTIVITAMIN) capsule Take 1 capsule by mouth daily.        . propranolol (INDERAL) 10 MG tablet Take 10 mg by mouth as needed.      .  nitroGLYCERIN (NITROSTAT) 0.4 MG SL tablet Place 1 tablet (0.4 mg total) under the tongue every 5 (five) minutes as needed for chest pain.  100 tablet  3    BP 110/60  Pulse 70  Temp(Src) 98.1 F (36.7 C) (Oral)  Resp 16  Ht 5' 4.75" (1.645 m)  Wt 132 lb (59.875 kg)  BMI 22.14 kg/m2  SpO2 95%      Review of Systems  Constitutional: Negative for fever, appetite change, fatigue and unexpected weight change.  HENT: Negative for hearing loss, ear pain, nosebleeds, congestion, sore throat, mouth sores, trouble swallowing, neck stiffness, dental problem, voice change, sinus pressure and tinnitus.   Eyes: Negative for photophobia, pain, redness and visual disturbance.  Respiratory: Negative for cough, chest tightness and shortness of breath.   Cardiovascular: Negative for chest pain, palpitations and leg swelling.  Gastrointestinal: Negative for nausea, vomiting, abdominal pain, diarrhea, constipation, blood in stool, abdominal distention and rectal pain.  Genitourinary: Negative for dysuria, urgency, frequency, hematuria, flank pain, vaginal bleeding, vaginal discharge, difficulty urinating, genital sores, vaginal pain, menstrual problem and pelvic pain.  Musculoskeletal: Negative for back pain and arthralgias.  Skin: Negative for rash.  Neurological: Negative for dizziness, syncope, speech difficulty, weakness, light-headedness, numbness and headaches.  Hematological: Negative for adenopathy. Does not bruise/bleed easily.  Psychiatric/Behavioral: Negative for suicidal ideas, behavioral problems, self-injury, dysphoric mood and agitation. The patient is nervous/anxious.        Objective:   Physical Exam  Constitutional: She is oriented to person, place, and time. She appears well-developed and well-nourished.  HENT:  Head: Normocephalic and atraumatic.  Right Ear: External ear normal.  Left Ear: External ear normal.  Mouth/Throat: Oropharynx is clear and moist.  Eyes: Conjunctivae  and EOM are normal.  Neck: Normal range of motion. Neck supple. No JVD present. No thyromegaly present.  Cardiovascular: Normal rate, regular rhythm, normal heart sounds and intact distal pulses.   No murmur heard. Pulmonary/Chest: Effort normal and breath sounds normal. She has no wheezes. She has no rales.  Abdominal: Soft. Bowel sounds are normal. She exhibits no distension and no mass. There is no tenderness. There is no rebound and no guarding.  Musculoskeletal: Normal range of motion. She exhibits no edema and no tenderness.  Neurological: She is alert and oriented to person, place, and time. She has normal reflexes. No cranial nerve deficit. She exhibits normal muscle tone. Coordination normal.  Skin: Skin is warm and dry. No rash noted.  Psychiatric: She has a normal mood and affect. Her behavior is normal.  Assessment & Plan:   Preventive health exam Mild hypertension stable Dyslipidemia. Cardiology followup in August as scheduled Return here in one year

## 2011-12-29 ENCOUNTER — Encounter: Payer: Self-pay | Admitting: Obstetrics and Gynecology

## 2012-01-11 ENCOUNTER — Other Ambulatory Visit: Payer: Self-pay | Admitting: Dermatology

## 2012-02-09 ENCOUNTER — Ambulatory Visit (INDEPENDENT_AMBULATORY_CARE_PROVIDER_SITE_OTHER): Payer: Medicare Other | Admitting: Obstetrics and Gynecology

## 2012-02-09 ENCOUNTER — Encounter: Payer: Self-pay | Admitting: Obstetrics and Gynecology

## 2012-02-09 VITALS — BP 120/74 | Ht 64.5 in | Wt 134.0 lb

## 2012-02-09 DIAGNOSIS — M899 Disorder of bone, unspecified: Secondary | ICD-10-CM

## 2012-02-09 DIAGNOSIS — M949 Disorder of cartilage, unspecified: Secondary | ICD-10-CM

## 2012-02-09 DIAGNOSIS — R351 Nocturia: Secondary | ICD-10-CM

## 2012-02-09 DIAGNOSIS — M8580 Other specified disorders of bone density and structure, unspecified site: Secondary | ICD-10-CM

## 2012-02-09 DIAGNOSIS — N952 Postmenopausal atrophic vaginitis: Secondary | ICD-10-CM

## 2012-02-09 NOTE — Progress Notes (Signed)
The patient came to see me today for further followup. The most important issue is her osteopenia. When we did her last bone density her worst T score was -2.3. Her fracture risk was approaching elevated with a hip fracture risk of 2.7%. We discussed biphosphonate. She elected not to do it. She has had no fractures. She takes calcium and vitamin D. She has had her last mammogram. She lost her husband earlier this year to esophageal cancer. She had atrophic vaginitis and we've given her prescription for estrogen cream due to dyspareunia but she never used it. Obviously now it is not an issue. She is having no vaginal bleeding. She is having no pelvic pain. She does have occasional nocturia without urgency or urinary incontinence. She did her urine this year at her internist and it  was normal. She has always had normal Pap smears. Her last Pap smear was June of 2012.  ROS: 12 system review done. Pertinent positives above. Other positives include hyperlipidemia,renal stones,Takotsubo cardiomyopathy that she sees a cardiologist for and is now fine, anxiety, and headaches.  Physical examination: Sherrilyn Rist present. HEENT within normal limits. Neck: Thyroid not large. No masses. Supraclavicular nodes: not enlarged. Breasts: Examined in both sitting and lying  position. No skin changes and no masses. Abdomen: Soft no guarding rebound or masses or hernia. Pelvic: External: Within normal limits. BUS: Within normal limits. Vaginal:within normal limits. Poor  estrogen effect. No evidence of cystocele rectocele or enterocele. Cervix: clean. Uterus: Normal size and shape. Adnexa: No masses. Rectovaginal exam: Confirmatory and negative. Extremities: Within normal limits.  Assessment: #1. Significant osteopenia #2. Atrophic vaginitis #3. Nocturia  Plan: Mammogram and  bone density next year. No Pap. Discussed new guidelines that she does not need further Paps.

## 2012-03-27 ENCOUNTER — Other Ambulatory Visit: Payer: Self-pay | Admitting: Internal Medicine

## 2012-04-09 ENCOUNTER — Ambulatory Visit (HOSPITAL_COMMUNITY): Payer: Medicare Other | Attending: Cardiology

## 2012-04-09 DIAGNOSIS — I379 Nonrheumatic pulmonary valve disorder, unspecified: Secondary | ICD-10-CM | POA: Insufficient documentation

## 2012-04-09 DIAGNOSIS — E785 Hyperlipidemia, unspecified: Secondary | ICD-10-CM | POA: Insufficient documentation

## 2012-04-09 DIAGNOSIS — I079 Rheumatic tricuspid valve disease, unspecified: Secondary | ICD-10-CM | POA: Insufficient documentation

## 2012-04-09 DIAGNOSIS — I5181 Takotsubo syndrome: Secondary | ICD-10-CM | POA: Insufficient documentation

## 2012-04-09 DIAGNOSIS — I080 Rheumatic disorders of both mitral and aortic valves: Secondary | ICD-10-CM | POA: Insufficient documentation

## 2012-04-09 NOTE — Progress Notes (Signed)
Echocardiogram performed.  

## 2012-04-10 ENCOUNTER — Telehealth: Payer: Self-pay | Admitting: Cardiovascular Disease

## 2012-04-10 NOTE — Telephone Encounter (Signed)
Done, see echo result note

## 2012-04-10 NOTE — Telephone Encounter (Signed)
New problem:  Patient calling regarding test results.  

## 2012-04-26 ENCOUNTER — Encounter: Payer: Self-pay | Admitting: Cardiovascular Disease

## 2012-04-26 ENCOUNTER — Ambulatory Visit (INDEPENDENT_AMBULATORY_CARE_PROVIDER_SITE_OTHER): Payer: Medicare Other | Admitting: Cardiovascular Disease

## 2012-04-26 VITALS — BP 92/58 | HR 62 | Ht 64.5 in | Wt 131.8 lb

## 2012-04-26 DIAGNOSIS — I10 Essential (primary) hypertension: Secondary | ICD-10-CM

## 2012-04-26 DIAGNOSIS — I5181 Takotsubo syndrome: Secondary | ICD-10-CM

## 2012-04-26 MED ORDER — LISINOPRIL 5 MG PO TABS: 5.0000 mg | ORAL_TABLET | Freq: Every day | ORAL | Status: DC

## 2012-04-26 NOTE — Progress Notes (Addendum)
Jocelyn Day Date of Birth  04-Apr-1941 [facility]     [place] Office  [address]    [address]   [address] [place], [place]  [address]    [address], [place]  [address] [phone]  Fax  [phone]  [phone]  Fax [phone]  Problem List: 1.  Takosubo Syndrome - Feb, 2012, EF = 30%, echo in April, 2012 revealed EF of 45-50%  History of Present Illness:  Jocelyn Day is a 71 yo with a hx of Takosubo Syndrome.  She has not had any recurrent chest pain.  Her husband died of esophageal cancer this past December. She is exercising some.     Current Outpatient Prescriptions on File Prior to Visit  Medication Sig Dispense Refill  . aspirin 81 MG tablet Take 81 mg by mouth daily.        . Calcium Carb-Cholecalciferol (CALCIUM PLUS VITAMIN D3 PO) Take by mouth 2 (two) times daily.        . carvedilol (COREG) 12.5 MG tablet One 12.5 mg tablet twice daily  180 tablet  5  . citalopram (CELEXA) 40 MG tablet TAKE 1 TABLET BY MOUTH DAILY  30 tablet  5  . lisinopril (PRINIVIL,ZESTRIL) 10 MG tablet TAKE 1 TABLET BY MOUTH DAILY  30 tablet  11    No Known Allergies  Past Medical History  Diagnosis Date  . ACS (acute coronary syndrome) 10/20/2010    Normal coronaries per cath 2/12  . Takotsubo cardiomyopathy 10/20/2010    EF of 30% per cath 2/12, EF is 50% as if 4/12  . Anxiety   . Calcium oxalate renal stones   . Osteoporosis   . Atrophic vaginitis   . Insomnia   . Takotsubo cardiomyopathy 10/21/2010    Acute coronary syndrome/Takotsubo cardiomyopathy    Past Surgical History  Procedure Date  . Cholecystectomy 1993  . Cesarean section 1974  . Cesarean section 1977  . Dilation and curettage of uterus 1990  . Kidney stone surgery 2010  . Cardiac catheterization 10/21/2010    Normal coronary arteries -- Apical ballooning syndrome consistent with tako-tsubo cardiomyopathy with an ejection fraction of 30%  . Basal cell carinoma excised 2005    History  Smoking  status  . Never Smoker   Smokeless tobacco  . Not on file    History  Alcohol Use  . Yes    once a month    Family History  Problem Relation Age of Onset  . Hypertension Mother   . Cancer Father     prostate  . Heart disease Neg Hx   . Diabetes Brother     liver translplant and medication induced the diabetes  . Diabetes Paternal Uncle   . Diabetes Cousin     Reviw of Systems:  Reviewed in the HPI.  All other systems are negative.  Physical Exam: Blood pressure 84/60, pulse 62, height 5' 4.5" (1.638 m), weight 131 lb 12.8 oz (59.784 kg). General: Well developed, well nourished, in no acute distress.  Head: Normocephalic, atraumatic, sclera non-icteric, mucus membranes are moist,   Neck: Supple. Negative for carotid bruits. JVD not elevated.  Lungs: Clear bilaterally to auscultation without wheezes, rales, or rhonchi. Breathing is unlabored.  Heart: RRR with S1 S2. No murmurs, rubs, or gallops appreciated.  Abdomen: Soft, non-tender, non-distended with normoactive bowel sounds. No hepatomegaly. No rebound/guarding. No obvious abdominal masses.  Msk:  Strength and tone appear normal for age.  Extremities: No clubbing or cyanosis.  No edema.  Distal pedal pulses are 2+ and equal bilaterally.  Neuro: Alert and oriented X 3. Moves all extremities spontaneously.  Psych:  Responds to questions appropriately with a normal affect.  ECG: 04/26/2012-normal sinus rhythm at 62 beats a minute. She has no ST or T wave changes. Assessment / Plan:

## 2012-04-26 NOTE — Patient Instructions (Addendum)
Your physician has recommended you make the following change in your medication: DECREASE your Lisinopril to 5mg  daily  Your physician wants you to follow-up in: 6 months.   You will receive a reminder letter in the mail two months in advance. If you don't receive a letter, please call our office to schedule the follow-up appointment.

## 2012-04-26 NOTE — Assessment & Plan Note (Signed)
Jocelyn Day is doing very well. Her last echo shows that her left ventricular systolic function is now normal. She started with an ejection fraction of 35% and now her EF is between 55 and 60%.  Her blood pressure is a little on the low side. We will decrease her lisinopril to 5 mg a day.  I will see her again in 6 months for followup visit.

## 2012-05-03 ENCOUNTER — Encounter: Payer: Self-pay | Admitting: Internal Medicine

## 2012-10-10 ENCOUNTER — Other Ambulatory Visit: Payer: Self-pay | Admitting: Emergency Medicine

## 2012-10-10 DIAGNOSIS — I5181 Takotsubo syndrome: Secondary | ICD-10-CM

## 2012-10-10 MED ORDER — CARVEDILOL 12.5 MG PO TABS: ORAL_TABLET | ORAL | Status: DC

## 2012-11-06 ENCOUNTER — Encounter: Payer: Self-pay | Admitting: Internal Medicine

## 2012-11-08 ENCOUNTER — Encounter: Payer: Self-pay | Admitting: Internal Medicine

## 2012-12-20 ENCOUNTER — Ambulatory Visit (AMBULATORY_SURGERY_CENTER): Payer: Medicare Other | Admitting: *Deleted

## 2012-12-20 ENCOUNTER — Encounter: Payer: Self-pay | Admitting: Internal Medicine

## 2012-12-20 VITALS — Ht 64.0 in | Wt 133.2 lb

## 2012-12-20 DIAGNOSIS — Z1211 Encounter for screening for malignant neoplasm of colon: Secondary | ICD-10-CM

## 2012-12-20 DIAGNOSIS — Z86010 Personal history of colonic polyps: Secondary | ICD-10-CM

## 2012-12-20 MED ORDER — MOVIPREP 100 G PO SOLR: 1.0000 | Freq: Once | ORAL | Status: DC

## 2012-12-20 NOTE — Progress Notes (Signed)
No egg or soy allergy. ewm No problems with any sedation in the past. ewm

## 2012-12-21 ENCOUNTER — Telehealth: Payer: Self-pay | Admitting: Internal Medicine

## 2012-12-21 NOTE — Telephone Encounter (Signed)
Talked with pt.  Okay for her to take meds morning of procedure

## 2012-12-21 NOTE — Telephone Encounter (Signed)
No answer will try later.  

## 2013-01-01 ENCOUNTER — Encounter: Payer: Self-pay | Admitting: Internal Medicine

## 2013-01-01 ENCOUNTER — Ambulatory Visit (AMBULATORY_SURGERY_CENTER): Payer: Medicare Other | Admitting: Internal Medicine

## 2013-01-01 VITALS — BP 114/49 | HR 53 | Temp 97.4°F | Resp 23 | Ht 64.0 in | Wt 133.0 lb

## 2013-01-01 DIAGNOSIS — Z1211 Encounter for screening for malignant neoplasm of colon: Secondary | ICD-10-CM

## 2013-01-01 DIAGNOSIS — Z86010 Personal history of colonic polyps: Secondary | ICD-10-CM

## 2013-01-01 MED ORDER — SODIUM CHLORIDE 0.9 % IV SOLN: 500.0000 mL | INTRAVENOUS | Status: DC

## 2013-01-01 NOTE — Patient Instructions (Signed)
Diverticulosis seen today, see handout given. Return to the care of your primary provider. Follow up with Dr.Perry as needed. Resume current medications. Call us with any questions or concerns. Thank you!!  YOU HAD AN ENDOSCOPIC PROCEDURE TODAY AT THE [facility]: Refer to the procedure report that was given to you for any specific questions about what was found during the examination.  If the procedure report does not answer your questions, please call your gastroenterologist to clarify.  If you requested that your care partner not be given the details of your procedure findings, then the procedure report has been included in a sealed envelope for you to review at your convenience later.  YOU SHOULD EXPECT: Some feelings of bloating in the abdomen. Passage of more gas than usual.  Walking can help get rid of the air that was put into your GI tract during the procedure and reduce the bloating. If you had a lower endoscopy (such as a colonoscopy or flexible sigmoidoscopy) you may notice spotting of blood in your stool or on the toilet paper. If you underwent a bowel prep for your procedure, then you may not have a normal bowel movement for a few days.  DIET: Your first meal following the procedure should be a light meal and then it is ok to progress to your normal diet.  A half-sandwich or bowl of soup is an example of a good first meal.  Heavy or fried foods are harder to digest and may make you feel nauseous or bloated.  Likewise meals heavy in dairy and vegetables can cause extra gas to form and this can also increase the bloating.  Drink plenty of fluids but you should avoid alcoholic beverages for 24 hours.  ACTIVITY: Your care partner should take you home directly after the procedure.  You should plan to take it easy, moving slowly for the rest of the day.  You can resume normal activity the day after the procedure however you should NOT DRIVE or use heavy machinery for 24 hours (because of  the sedation medicines used during the test).    SYMPTOMS TO REPORT IMMEDIATELY: A gastroenterologist can be reached at any hour.  During normal business hours, 8:30 AM to 5:00 PM Monday through Friday, call [phone].  After hours and on weekends, please call the GI answering service at [phone] who will take a message and have the physician on call contact you.   Following lower endoscopy (colonoscopy or flexible sigmoidoscopy):  Excessive amounts of blood in the stool  Significant tenderness or worsening of abdominal pains  Swelling of the abdomen that is new, acute  Fever of 100F or higher  Following upper endoscopy (EGD)  Vomiting of blood or coffee ground material  New chest pain or pain under the shoulder blades  Painful or persistently difficult swallowing  New shortness of breath  Fever of 100F or higher  Black, tarry-looking stools  FOLLOW UP: If any biopsies were taken you will be contacted by phone or by letter within the next 1-3 weeks.  Call your gastroenterologist if you have not heard about the biopsies in 3 weeks.  Our staff will call the home number listed on your records the next business day following your procedure to check on you and address any questions or concerns that you may have at that time regarding the information given to you following your procedure. This is a courtesy call and so if there is no answer at the home number  and we have not heard from you through the emergency physician on call, we will assume that you have returned to your regular daily activities without incident.  SIGNATURES/CONFIDENTIALITY: You and/or your care partner have signed paperwork which will be entered into your electronic medical record.  These signatures attest to the fact that that the information above on your After Visit Summary has been reviewed and is understood.  Full responsibility of the confidentiality of this discharge information lies with you and/or your  care-partner.

## 2013-01-01 NOTE — Op Note (Signed)
[facility] [address] [place], [address]   COLONOSCOPY PROCEDURE REPORT  PATIENT: Jocelyn Day, Jocelyn Day  MR#: [id number] BIRTHDATE: Jan 25, 1941 , 71  yrs. old GENDER: Female ENDOSCOPIST: Roxy Cedar, MD REFERRED ON:[id number] Program Recall PROCEDURE DATE:  01/01/2013 PROCEDURE:   Colonoscopy, surveillance ASA CLASS:   Class II INDICATIONS:Patient's personal history of adenomatous colon polyps. Index 2003 (HP); 2009 (2 small TA's) MEDICATIONS: MAC sedation, administered by CRNA and propofol (Diprivan) 300mg  IV  DESCRIPTION OF PROCEDURE:   After the risks benefits and alternatives of the procedure were thoroughly explained, informed consent was obtained.  A digital rectal exam revealed no abnormalities of the rectum.   The LB [id number]  endoscope was introduced through the anus and advanced to the cecum, which was identified by both the appendix and ileocecal valve. No adverse events experienced.   The quality of the prep was excellent, using MoviPrep  The instrument was then slowly withdrawn as the colon was fully examined.      COLON FINDINGS: Moderate diverticulosis was noted The finding was in the left colon.   The colon was otherwise normal.  There was no inflammation, polyps or cancers.  Retroflexed views revealed no abnormalities. The time to cecum=3 minutes 50 seconds.  Withdrawal time=10 minutes 10 seconds.  The scope was withdrawn and the procedure completed. COMPLICATIONS: There were no complications.  ENDOSCOPIC IMPRESSION: 1.   Moderate diverticulosis was noted in the left colon 2.   The colon was otherwise normal  RECOMMENDATIONS: 1. Return to the care of your primary provider.  GI follow up as needed   eSigned:  Roxy Cedar, MD 01/01/2013 12:17 PM   cc: Gordy Savers, MD and The Patient

## 2013-01-01 NOTE — Progress Notes (Signed)
Patient did not experience any of the following events: a burn prior to discharge; a fall within the facility; wrong site/side/patient/procedure/implant event; or a hospital transfer or hospital admission upon discharge from the facility. (G8907) Patient did not have preoperative order for IV antibiotic SSI prophylaxis. (G8918)  

## 2013-01-02 ENCOUNTER — Telehealth: Payer: Self-pay | Admitting: *Deleted

## 2013-01-02 NOTE — Telephone Encounter (Signed)
  Follow up Call-  Call back number 01/01/2013  Post procedure Call Back phone  # [phone]  Permission to leave phone message Yes     Patient questions:  Do you have a fever, pain , or abdominal swelling? no Pain Score  0 *  Have you tolerated food without any problems? yes  Have you been able to return to your normal activities? yes  Do you have any questions about your discharge instructions: Diet   no Medications  no Follow up visit  no  Do you have questions or concerns about your Care? no  Actions: * If pain score is 4 or above: No action needed, pain <4.

## 2013-01-08 ENCOUNTER — Encounter: Payer: Self-pay | Admitting: Gynecology

## 2013-01-09 ENCOUNTER — Encounter: Payer: Self-pay | Admitting: Gynecology

## 2013-01-18 ENCOUNTER — Ambulatory Visit (INDEPENDENT_AMBULATORY_CARE_PROVIDER_SITE_OTHER): Payer: Medicare Other | Admitting: Cardiovascular Disease

## 2013-01-18 ENCOUNTER — Encounter: Payer: Self-pay | Admitting: Cardiovascular Disease

## 2013-01-18 VITALS — BP 120/58 | HR 59 | Ht 64.0 in | Wt 131.0 lb

## 2013-01-18 DIAGNOSIS — I5181 Takotsubo syndrome: Secondary | ICD-10-CM

## 2013-01-18 NOTE — Assessment & Plan Note (Signed)
Jocelyn Day is doing great.  She has remained active.  No CP or dyspnea.   We will continue with her current medications.  I will see her again in 1 year for OV.

## 2013-01-18 NOTE — Progress Notes (Signed)
Jocelyn Day Date of Birth  09/24/1940 [facility]     [place] [facility]  [address]    [address]   [address] [place], [place]  [address]    [place], [place]  [address] [phone]  Fax  [phone]  [phone]  Fax [phone]  Problem List: 1.  Takosubo Syndrome - Feb, 2012, EF = 30%, echo in April, 2012 revealed EF of 45-50%  History of Present Illness:  Jocelyn Day is a 72 yo with a hx of Takosubo Syndrome.  She has not had any recurrent chest pain.  Her husband died of esophageal cancer this past December. She is exercising some.    Jan 18, 2013:  She is doing well.    No CP. No dyspnea.    Current Outpatient Prescriptions on File Prior to Visit  Medication Sig Dispense Refill  . aspirin 81 MG tablet Take 81 mg by mouth daily.        . Calcium Carb-Cholecalciferol (CALCIUM PLUS VITAMIN D3 PO) Take by mouth 2 (two) times daily.        . carvedilol (COREG) 12.5 MG tablet One 12.5 mg tablet twice daily  180 tablet  2  . diphenhydrAMINE (SOMINEX) 25 MG tablet Take 25 mg by mouth at bedtime as needed for sleep.      . fish oil-omega-3 fatty acids 1000 MG capsule Take 2 g by mouth daily.      [name] lisinopril (PRINIVIL,ZESTRIL) 5 MG tablet Take 1 tablet (5 mg total) by mouth daily.  30 tablet  11   No current facility-administered medications on file prior to visit.    No Known Allergies  Past Medical History  Diagnosis Date  . ACS (acute coronary syndrome) 10/20/2010    Normal coronaries per cath 2/12  . Takotsubo cardiomyopathy 10/20/2010    EF of 30% per cath 2/12, EF is 50% as if 4/12  . Anxiety   . Calcium oxalate renal stones   . Osteoporosis   . Atrophic vaginitis   . Insomnia   . Takotsubo cardiomyopathy 10/21/2010    Acute coronary syndrome/Takotsubo cardiomyopathy  . Cancer     skin cancer-basal cell    Past Surgical History  Procedure Laterality Date  . Cholecystectomy  1993  . Cesarean section  1974  . Cesarean section  1977  .  Dilation and curettage of uterus  1990  . Kidney stone surgery  2010  . 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 Basal cell carinoma excised  2005  . Colonoscopy    . Polypectomy      History  Smoking status  . Never Smoker   Smokeless tobacco  . Never Used    History  Alcohol Use  . Yes    Comment: once or twice  a month a glasses of wine    Family History  Problem Relation Age of Onset  . Hypertension Mother   . Cancer Father     prostate  . Heart disease Neg Hx   . Colon cancer Neg Hx   . Diabetes Brother     liver translplant and medication induced the diabetes  . Diabetes Paternal Uncle   . Diabetes Cousin     Reviw of Systems:  Reviewed in the HPI.  All other systems are negative.  Physical Exam: Blood pressure 120/58, pulse 59, height 5\' 4"  (1.626 m),  weight 131 lb (59.421 kg). General: Well developed, well nourished, in no acute distress.  Head: Normocephalic, atraumatic, sclera non-icteric, mucus membranes are moist,   Neck: Supple. Negative for carotid bruits. JVD not elevated.  Lungs: Clear bilaterally to auscultation without wheezes, rales, or rhonchi. Breathing is unlabored.  Heart: RRR with S1 S2. No murmurs, rubs, or gallops appreciated.  Abdomen: Soft, non-tender, non-distended with normoactive bowel sounds. No hepatomegaly. No rebound/guarding. No obvious abdominal masses.  Msk:  Strength and tone appear normal for age.  Extremities: No clubbing or cyanosis. No edema.  Distal pedal pulses are 2+ and equal bilaterally.  Neuro: Alert and oriented X 3. Moves all extremities spontaneously.  Psych:  Responds to questions appropriately with a normal affect.  ECG: Jan 18, 2013:  Sinus brady at 63, normal ECG  Assessment / Plan:

## 2013-01-18 NOTE — Patient Instructions (Addendum)
Your physician wants you to follow-up in: 1 year  You will receive a reminder letter in the mail two months in advance. If you don't receive a letter, please call our office to schedule the follow-up appointment.  Your physician recommends that you continue on your current medications as directed. Please refer to the Current Medication list given to you today.  

## 2013-02-14 ENCOUNTER — Ambulatory Visit (INDEPENDENT_AMBULATORY_CARE_PROVIDER_SITE_OTHER): Payer: Medicare Other | Admitting: Gynecology

## 2013-02-14 ENCOUNTER — Encounter: Payer: Self-pay | Admitting: Gynecology

## 2013-02-14 VITALS — BP 120/74 | Ht 64.0 in | Wt 128.0 lb

## 2013-02-14 DIAGNOSIS — M899 Disorder of bone, unspecified: Secondary | ICD-10-CM

## 2013-02-14 DIAGNOSIS — M8580 Other specified disorders of bone density and structure, unspecified site: Secondary | ICD-10-CM

## 2013-02-14 DIAGNOSIS — N952 Postmenopausal atrophic vaginitis: Secondary | ICD-10-CM

## 2013-02-14 NOTE — Patient Instructions (Signed)
Follow up in one year, sooner as needed. 

## 2013-02-14 NOTE — Progress Notes (Signed)
Jocelyn Day 04/19/1941 [id number]        72 y.o.  G2P2002 for followup exam.  Former patient of Dr. Eda Paschal. Several issues noted below.  Past medical history,surgical history, medications, allergies, family history and social history were all reviewed and documented in the EPIC chart.  ROS:  Performed and pertinent positives and negatives are included in the history, assessment and plan .  Exam: Kim assistant Filed Vitals:   02/14/13 1016  BP: 120/74  Height: 5\' 4"  (1.626 m)  Weight: 128 lb (58.06 kg)   General appearance  Normal Skin grossly normal Head/Neck normal with no cervical or supraclavicular adenopathy thyroid normal Lungs  clear Cardiac RR, without RMG Abdominal  soft, nontender, without masses, organomegaly or hernia Breasts  examined lying and sitting without masses, retractions, discharge or axillary adenopathy. Pelvic  Ext/BUS/vagina  with atrophic changes  Cervix  normal with atrophic changes   Uterus  anteverted, normal size, shape and contour, midline and mobile nontender   Adnexa  Without masses or tenderness    Anus and perineum  normal   Rectovaginal  normal sphincter tone without palpated masses or tenderness.    Assessment/Plan:  72 y.o. [id number] female for followup exam.   1. Postmenopausal. Patient without significant symptoms such as hot flashes, night sweats, vaginal dryness. She is not sexually active. 2. Osteopenia. DEXA 12/2012 with T score -2.2. No FRAX done due to prior bisphosphate treatment. Patient had been on Fosamax and Actonel from 2000 09/23/2008. In comparison to her prior exit 2012 there has been no change in her bone density. She did have a FRAX calculated with her 2012 [place] which showed 12%/2.7%. The issues of increased fracture risk discussed. Whether a FRAX is legitimate with any prior treatment or not it can be used if she is X number of years out from treatment was all reviewed with her. Options to reinitiate treatment now versus  continued observation with repeat DEXA in 2 years again recognizing the risk of fracture discussed and she is more comfortable with waiting and repeating her DEXA in 2 years given that there has been no significant decline in her DEXA. Increase calcium vitamin D reviewed. 3. Mammography 12/2012. Continue with annual mammography. SBE monthly reviewed. 4. Pap smear 2012. No Pap smear done today. No history of abnormal Pap smears previously. Reviewed current screening guidelines and we plan to stop screening issues over the age of 40 and she is comfortable with this. 5. Colonoscopy 2014. Repeat at their recommended interval. 6. Health maintenance. No blood work done as this is all done through her primary physician's office. Followup in one year, sooner as needed.    Dara Lords MD, 11:05 AM 02/14/2013

## 2013-02-15 LAB — URINALYSIS W MICROSCOPIC + REFLEX CULTURE
Glucose, UA: NEGATIVE mg/dL
Leukocytes, UA: NEGATIVE
Nitrite: NEGATIVE
Protein, ur: NEGATIVE mg/dL
Squamous Epithelial / LPF: NONE SEEN
Urobilinogen, UA: 0.2 mg/dL (ref 0.0–1.0)

## 2013-02-17 LAB — URINE CULTURE

## 2013-02-18 ENCOUNTER — Other Ambulatory Visit: Payer: Self-pay | Admitting: Gynecology

## 2013-02-18 MED ORDER — SULFAMETHOXAZOLE-TRIMETHOPRIM 800-160 MG PO TABS: 1.0000 | ORAL_TABLET | Freq: Two times a day (BID) | ORAL | Status: AC

## 2013-03-12 ENCOUNTER — Other Ambulatory Visit: Payer: Medicare Other

## 2013-03-14 ENCOUNTER — Other Ambulatory Visit: Payer: Medicare Other

## 2013-03-14 ENCOUNTER — Other Ambulatory Visit (INDEPENDENT_AMBULATORY_CARE_PROVIDER_SITE_OTHER): Payer: Medicare Other

## 2013-03-14 DIAGNOSIS — E785 Hyperlipidemia, unspecified: Secondary | ICD-10-CM

## 2013-03-14 DIAGNOSIS — Z0000 Encounter for general adult medical examination without abnormal findings: Secondary | ICD-10-CM

## 2013-03-14 LAB — HEPATIC FUNCTION PANEL
ALT: 14 U/L (ref 0–35)
AST: 22 U/L (ref 0–37)
Albumin: 4 g/dL (ref 3.5–5.2)
Alkaline Phosphatase: 56 U/L (ref 39–117)
Bilirubin, Direct: 0 mg/dL (ref 0.0–0.3)
Total Bilirubin: 0.7 mg/dL (ref 0.3–1.2)
Total Protein: 7.1 g/dL (ref 6.0–8.3)

## 2013-03-14 LAB — POCT URINALYSIS DIPSTICK
Ketones, UA: NEGATIVE
Protein, UA: NEGATIVE

## 2013-03-14 LAB — BASIC METABOLIC PANEL
Chloride: 101 mEq/L (ref 96–112)
Creatinine, Ser: 0.9 mg/dL (ref 0.4–1.2)
Potassium: 4.7 mEq/L (ref 3.5–5.1)

## 2013-03-14 LAB — LDL CHOLESTEROL, DIRECT: Direct LDL: 174.5 mg/dL

## 2013-03-14 LAB — CBC WITH DIFFERENTIAL/PLATELET
Basophils Absolute: 0 10*3/uL (ref 0.0–0.1)
Eosinophils Absolute: 0.1 10*3/uL (ref 0.0–0.7)
Lymphocytes Relative: 36.7 % (ref 12.0–46.0)
MCHC: 34.1 g/dL (ref 30.0–36.0)
Neutro Abs: 2.7 10*3/uL (ref 1.4–7.7)
Neutrophils Relative %: 55.8 % (ref 43.0–77.0)
Platelets: 207 10*3/uL (ref 150.0–400.0)
RDW: 12 % (ref 11.5–14.6)

## 2013-03-14 LAB — LIPID PANEL
Cholesterol: 250 mg/dL — ABNORMAL HIGH (ref 0–200)
HDL: 43.9 mg/dL (ref >39.00)
Triglycerides: 149 mg/dL (ref 0.0–149.0)
VLDL: 29.8 mg/dL (ref 0.0–40.0)

## 2013-03-19 ENCOUNTER — Ambulatory Visit (INDEPENDENT_AMBULATORY_CARE_PROVIDER_SITE_OTHER): Payer: Medicare Other | Admitting: Internal Medicine

## 2013-03-19 ENCOUNTER — Encounter: Payer: Self-pay | Admitting: Internal Medicine

## 2013-03-19 VITALS — BP 124/70 | HR 55 | Temp 98.5°F | Resp 18 | Ht 63.5 in | Wt 130.0 lb

## 2013-03-19 DIAGNOSIS — F411 Generalized anxiety disorder: Secondary | ICD-10-CM

## 2013-03-19 DIAGNOSIS — F419 Anxiety disorder, unspecified: Secondary | ICD-10-CM

## 2013-03-19 DIAGNOSIS — M949 Disorder of cartilage, unspecified: Secondary | ICD-10-CM

## 2013-03-19 DIAGNOSIS — I200 Unstable angina: Secondary | ICD-10-CM

## 2013-03-19 DIAGNOSIS — I10 Essential (primary) hypertension: Secondary | ICD-10-CM

## 2013-03-19 DIAGNOSIS — I249 Acute ischemic heart disease, unspecified: Secondary | ICD-10-CM

## 2013-03-19 DIAGNOSIS — E785 Hyperlipidemia, unspecified: Secondary | ICD-10-CM

## 2013-03-19 DIAGNOSIS — Z0000 Encounter for general adult medical examination without abnormal findings: Secondary | ICD-10-CM

## 2013-03-19 DIAGNOSIS — M899 Disorder of bone, unspecified: Secondary | ICD-10-CM

## 2013-03-19 NOTE — Progress Notes (Signed)
Patient ID: Jocelyn Day, female   DOB: 1940-11-20, 72 y.o.   MRN: [id number]  Subjective:    Patient ID: Jocelyn Day, female    DOB: August 28, 1940, 72 y.o.   MRN: [id number]  HPI  72 -year-old patient who is seen today for a preventive health examination. She is followed by cardiology for Takotsubp CM;  she has done well and her last ejection fraction was apparently 50%. She is scheduled for followup in August. She is also followed by OB/GYN and has had a recent gynecologic evaluation She has a history of anxiety depression and has been adjusting to the recent death of her husband in 08-15-11 from esophageal cancer. She has hypertension  Medicare wellness exam:  1. Risk factors, based on past  M,S,F history- cardiovascular risk factors include hypertension and mild dyslipidemia. She has a history of acute coronary syndrome and has had a heart catheterization that was free of significant coronary artery disease  2.  Physical activities: No activity restrictions  3.  Depression/mood: History of anxiety depression and grief reaction from the death of her husband  4.  Hearing: No deficits  5.  ADL's: Independent in all aspects of daily living  6.  Fall risk: Low  7.  Home safety: No problems identified  8.  Height weight, and visual acuity; heart weight stable no change in visual acuity  9.  Counseling: Heart healthy diet regular exercise all encouraged  10. Lab orders based on risk factors: Laboratory profile reviewed 11. Referral : Followup cardiology  12. Care plan: Continue present regimen. We'll probably taper and discontinue Celexa at some point later this year  13. Cognitive assessment: Alert and oriented normal affect. No cognitive dysfunction.   Past Medical History  Diagnosis Date  . ACS (acute coronary syndrome) 10/20/2010    Normal coronaries per cath 2/12  . Takotsubo cardiomyopathy 10/20/2010    EF of 30% per cath 2/12, EF is 50% as if 4/12  . Calcium oxalate  renal stones   . Osteoporosis   . Atrophic vaginitis   . Insomnia   . Takotsubo cardiomyopathy 10/21/2010    Acute coronary syndrome/Takotsubo cardiomyopathy  . Cancer     skin cancer-basal cell    History   Social History  . Marital Status: Married    Spouse Name: N/A    Number of Children: N/A  . Years of Education: N/A   Occupational History  . Not on file.   Social History Main Topics  . Smoking status: Never Smoker   . Smokeless tobacco: Never Used  . Alcohol Use: Yes     Comment: once or twice  a month a glasses of wine  . Drug Use: No  . Sexually Active: No   Other Topics Concern  . Not on file   Social History Narrative  . No narrative on file    Past Surgical History  Procedure Laterality Date  . Cholecystectomy  1993  . Cesarean section  1974  . Cesarean section  1977  . Dilation and curettage of uterus  1990  . Kidney stone surgery  2010  . 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 Basal cell carinoma excised  2005  . Colonoscopy    . Polypectomy      Family History  Problem Relation Age of Onset  . Hypertension Mother   . Cancer Father  prostate  . Heart disease Neg Hx   . Colon cancer Neg Hx   . Diabetes Brother     liver translplant and medication induced the diabetes  . Diabetes Paternal Uncle   . Diabetes Cousin     No Known Allergies  Current Outpatient Prescriptions on File Prior to Visit  Medication Sig Dispense Refill  . aspirin 81 MG tablet Take 81 mg by mouth daily.        . Calcium Carb-Cholecalciferol (CALCIUM PLUS VITAMIN D3 PO) Take by mouth 2 (two) times daily.        . carvedilol (COREG) 12.5 MG tablet One 12.5 mg tablet twice daily  180 tablet  2  . diphenhydrAMINE (BENADRYL ALLERGY) 25 mg capsule Take 25 mg by mouth every 6 (six) hours as needed for itching.      [name] lisinopril  (PRINIVIL,ZESTRIL) 5 MG tablet Take 1 tablet (5 mg total) by mouth daily.  30 tablet  11   No current facility-administered medications on file prior to visit.    BP 124/70  Pulse 55  Temp(Src) 98.5 F (36.9 C) (Oral)  Resp 18  Ht 5' 3.5" (1.613 m)  Wt 130 lb (58.968 kg)  BMI 22.66 kg/m2  SpO2 98%      Review of Systems  Constitutional: Negative for fever, appetite change, fatigue and unexpected weight change.  HENT: Negative for hearing loss, ear pain, nosebleeds, congestion, sore throat, mouth sores, trouble swallowing, neck stiffness, dental problem, voice change, sinus pressure and tinnitus.   Eyes: Negative for photophobia, pain, redness and visual disturbance.  Respiratory: Negative for cough, chest tightness and shortness of breath.   Cardiovascular: Negative for chest pain, palpitations and leg swelling.  Gastrointestinal: Negative for nausea, vomiting, abdominal pain, diarrhea, constipation, blood in stool, abdominal distention and rectal pain.  Genitourinary: Negative for dysuria, urgency, frequency, hematuria, flank pain, vaginal bleeding, vaginal discharge, difficulty urinating, genital sores, vaginal pain, menstrual problem and pelvic pain.  Musculoskeletal: Negative for back pain and arthralgias.  Skin: Negative for rash.  Neurological: Negative for dizziness, syncope, speech difficulty, weakness, light-headedness, numbness and headaches.  Hematological: Negative for adenopathy. Does not bruise/bleed easily.  Psychiatric/Behavioral: Negative for suicidal ideas, behavioral problems, self-injury, dysphoric mood and agitation. The patient is nervous/anxious.        Objective:   Physical Exam  Constitutional: She is oriented to person, place, and time. She appears well-developed and well-nourished.  HENT:  Head: Normocephalic and atraumatic.  Right Ear: External ear normal.  Left Ear: External ear normal.  Mouth/Throat: Oropharynx is clear and moist.  Eyes:  Conjunctivae and EOM are normal.  Neck: Normal range of motion. Neck supple. No JVD present. No thyromegaly present.  Cardiovascular: Normal rate, regular rhythm, normal heart sounds and intact distal pulses.   No murmur heard. Pulmonary/Chest: Effort normal and breath sounds normal. She has no wheezes. She has no rales.  Abdominal: Soft. Bowel sounds are normal. She exhibits no distension and no mass. There is no tenderness. There is no rebound and no guarding.  Musculoskeletal: Normal range of motion. She exhibits no edema and no tenderness.  Neurological: She is alert and oriented to person, place, and time. She has normal reflexes. No cranial nerve deficit. She exhibits normal muscle tone. Coordination normal.  Skin: Skin is warm and dry. No rash noted.  Psychiatric: She has a normal mood and affect. Her behavior is normal.          Assessment & Plan:   Preventive health  exam Mild hypertension stable Dyslipidemia. Cardiology followup in August as scheduled Return here in one year

## 2013-03-19 NOTE — Patient Instructions (Signed)
Limit your sodium (Salt) intake    It is important that you exercise regularly, at least 20 minutes 3 to 4 times per week.  If you develop chest pain or shortness of breath seek  medical attention.  Take a calcium supplement, plus 800-1200 units of vitamin D  Return in one year for follow-up  

## 2013-05-15 ENCOUNTER — Other Ambulatory Visit: Payer: Self-pay | Admitting: Gynecology

## 2013-05-21 ENCOUNTER — Other Ambulatory Visit: Payer: Self-pay

## 2013-05-21 MED ORDER — LISINOPRIL 5 MG PO TABS: 5.0000 mg | ORAL_TABLET | Freq: Every day | ORAL | Status: DC

## 2013-06-13 ENCOUNTER — Ambulatory Visit: Payer: Medicare Other | Admitting: Cardiology

## 2013-06-21 ENCOUNTER — Other Ambulatory Visit: Payer: Self-pay | Admitting: Dermatology

## 2013-07-02 ENCOUNTER — Other Ambulatory Visit: Payer: Self-pay

## 2013-07-02 DIAGNOSIS — I5181 Takotsubo syndrome: Secondary | ICD-10-CM

## 2013-07-02 MED ORDER — CARVEDILOL 12.5 MG PO TABS: ORAL_TABLET | ORAL | Status: DC

## 2013-07-03 ENCOUNTER — Other Ambulatory Visit: Payer: Self-pay | Admitting: Dermatology

## 2014-01-14 ENCOUNTER — Encounter: Payer: Self-pay | Admitting: Gynecology

## 2014-01-20 ENCOUNTER — Ambulatory Visit (INDEPENDENT_AMBULATORY_CARE_PROVIDER_SITE_OTHER): Payer: Medicare Other | Admitting: Cardiovascular Disease

## 2014-01-20 ENCOUNTER — Encounter: Payer: Self-pay | Admitting: Cardiovascular Disease

## 2014-01-20 ENCOUNTER — Ambulatory Visit: Payer: Medicare Other | Admitting: Cardiovascular Disease

## 2014-01-20 VITALS — BP 121/73 | HR 63 | Ht 63.5 in | Wt 130.8 lb

## 2014-01-20 DIAGNOSIS — I5181 Takotsubo syndrome: Secondary | ICD-10-CM

## 2014-01-20 DIAGNOSIS — E785 Hyperlipidemia, unspecified: Secondary | ICD-10-CM

## 2014-01-20 NOTE — Assessment & Plan Note (Signed)
Her last LDL was 174. I think that she should have a goal LDL of around 100. I would like to check her lipids but she stated that she would have it checked at her primary medical doctor's office. I would have a  very low threshold to start her on atorvastatin to try to achieve an LDL of around 100.

## 2014-01-20 NOTE — Progress Notes (Signed)
Jocelyn Day Date of Birth  Jan 16, 1941 [facility] [address]    [place]   [place], [place]  [address]    [place], [place]  [address] [phone]  Fax  [phone]  [phone]  Fax [phone]  Problem List: 1.  Takosubo Syndrome - Feb, 2012, EF = 30%, echo in April, 2012 revealed EF of 45-50%  History of Present Illness:  Jocelyn Day is a 73 yo with a hx of Takosubo Syndrome.  She has not had any recurrent chest pain.  Her husband died of esophageal cancer this past December. She is exercising some.    Jan 18, 2013:  She is doing well.    No CP. No dyspnea.    January 20, 2014:  Jocelyn Day is doing well.  Has had some shoulder problems.   Current Outpatient Prescriptions on File Prior to Visit  Medication Sig Dispense Refill  . aspirin 81 MG tablet Take 81 mg by mouth daily.        . Calcium Carb-Cholecalciferol (CALCIUM PLUS VITAMIN D3 PO) Take by mouth 2 (two) times daily.        . carvedilol (COREG) 12.5 MG tablet One 12.5 mg tablet twice daily  180 tablet  2  . Coenzyme Q10 (CO Q 10) 100 MG CAPS Take 1 capsule by mouth daily.      . diphenhydrAMINE (BENADRYL ALLERGY) 25 mg capsule Take 25 mg by mouth every 6 (six) hours as needed for itching.      [name] lisinopril (PRINIVIL,ZESTRIL) 5 MG tablet Take 1 tablet (5 mg total) by mouth daily.  90 tablet  3   No current facility-administered medications on file prior to visit.    No Known Allergies  Past Medical History  Diagnosis Date  . ACS (acute coronary syndrome) 10/20/2010    Normal coronaries per cath 2/12  . Takotsubo cardiomyopathy 10/20/2010    EF of 30% per cath 2/12, EF is 50% as if 4/12  . Calcium oxalate renal stones   . Osteoporosis   . Atrophic vaginitis   . Insomnia   . Takotsubo cardiomyopathy 10/21/2010    Acute coronary syndrome/Takotsubo cardiomyopathy  . Cancer     skin cancer-basal cell    Past Surgical History  Procedure Laterality Date  .  Cholecystectomy  1993  . Cesarean section  1974  . Cesarean section  1977  . Dilation and curettage of uterus  1990  . Kidney stone surgery  2010  . 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 Basal cell carinoma excised  2005  . Colonoscopy    . Polypectomy      History  Smoking status  . Never Smoker   Smokeless tobacco  . Never Used    History  Alcohol Use  . Yes    Comment: once or twice  a month a glasses of wine    Family History  Problem Relation Age of Onset  . Hypertension Mother   . Cancer Father     prostate  . Heart disease Neg Hx   . Colon cancer Neg Hx   . Diabetes Brother     liver translplant and medication induced the diabetes  . Diabetes Paternal Uncle   . Diabetes Cousin     Reviw of Systems:  Reviewed in the HPI.  All other  systems are negative.  Physical Exam: Blood pressure 121/73, pulse 63, height 5' 3.5" (1.613 m), weight 130 lb 12.8 oz (59.33 kg). General: Well developed, well nourished, in no acute distress.  Head: Normocephalic, atraumatic, sclera non-icteric, mucus membranes are moist,   Neck: Supple. Negative for carotid bruits. JVD not elevated.  Lungs: Clear bilaterally to auscultation without wheezes, rales, or rhonchi. Breathing is unlabored.  Heart: RRR with S1 S2. No murmurs, rubs, or gallops appreciated.  Abdomen: Soft, non-tender, non-distended with normoactive bowel sounds. No hepatomegaly. No rebound/guarding. No obvious abdominal masses.  Msk:  Strength and tone appear normal for age.  Extremities: No clubbing or cyanosis. No edema.  Distal pedal pulses are 2+ and equal bilaterally.  Neuro: Alert and oriented X 3. Moves all extremities spontaneously.  Psych:  Responds to questions appropriately with a normal affect.  ECG: 01/20/2014: Normal sinus rhythm at 63. She has nonspecific ST and T  wave changes.  Assessment / Plan:

## 2014-01-20 NOTE — Patient Instructions (Signed)
Your physician recommends that you continue on your current medications as directed. Please refer to the Current Medication list given to you today.  Your physician wants you to follow-up in: 1 year with Dr. Nahser.  You will receive a reminder letter in the mail two months in advance. If you don't receive a letter, please call our office to schedule the follow-up appointment.  

## 2014-01-20 NOTE — Assessment & Plan Note (Signed)
Rolene is doing very well. She's not had any recurrent episodes of chest discomfort. I've encouraged her to remain very active.

## 2014-03-11 ENCOUNTER — Ambulatory Visit (INDEPENDENT_AMBULATORY_CARE_PROVIDER_SITE_OTHER): Payer: Medicare Other | Admitting: Gynecology

## 2014-03-11 ENCOUNTER — Encounter: Payer: Self-pay | Admitting: Gynecology

## 2014-03-11 VITALS — BP 128/72 | Ht 64.5 in | Wt 132.6 lb

## 2014-03-11 DIAGNOSIS — M899 Disorder of bone, unspecified: Secondary | ICD-10-CM

## 2014-03-11 DIAGNOSIS — M949 Disorder of cartilage, unspecified: Secondary | ICD-10-CM

## 2014-03-11 DIAGNOSIS — N952 Postmenopausal atrophic vaginitis: Secondary | ICD-10-CM

## 2014-03-11 DIAGNOSIS — M8580 Other specified disorders of bone density and structure, unspecified site: Secondary | ICD-10-CM

## 2014-03-11 NOTE — Patient Instructions (Signed)
Followup in one year for annual exam, sooner if any issues 

## 2014-03-11 NOTE — Progress Notes (Signed)
Emiah Pellicano 1941-06-22 [id number]        73 y.o.  G2P2002 for followup exam.  Past medical history,surgical history, problem list, medications, allergies, family history and social history were all reviewed and documented as reviewed in the EPIC chart.  ROS:  12 system ROS performed with pertinent positives and negatives included in the history, assessment and plan.   Additional significant findings :  None   Exam: Sharrie Rothman assistant Filed Vitals:   03/11/14 1013  BP: 128/72  Height: 5' 4.5" (1.638 m)  Weight: 132 lb 9.6 oz (60.147 kg)   General appearance:  Normal affect, orientation and appearance. Skin: Grossly normal HEENT: Without gross lesions.  No cervical or supraclavicular adenopathy. Thyroid normal.  Lungs:  Clear without wheezing, rales or rhonchi Cardiac: RR, without RMG Abdominal:  Soft, nontender, without masses, guarding, rebound, organomegaly or hernia Breasts:  Examined lying and sitting without masses, retractions, discharge or axillary adenopathy. Pelvic:  Ext/BUS/vagina with generalized atrophic changes  Cervix atrophic  Uterus axial to anteverted, normal size, shape and contour, midline and mobile nontender   Adnexa  Without masses or tenderness    Anus and perineum  Normal   Rectovaginal  Normal sphincter tone without palpated masses or tenderness.    Assessment/Plan:  73 y.o. [id number] female for followup exam.   1. Postmenopausal with atrophic genital changes. Patient without significant symptoms of hot flushes, night sweats, vaginal dryness. Is not sexually active. No vaginal bleeding. Continue to monitor. Report any vaginal bleeding. 2. Osteopenia. DEXA 12/2012 with T score -2.2. Stable from prior DEXA 2012. Had been on Fosamax and Actonel from approximately 2002 through 2010. Plan repeat DEXA next year a two-year interval. Increase calcium vitamin D reviewed. 3. Pap smear 2012. No Pap smear done today. No history of abnormal Pap smears previously. Have  reviewed current screening guidelines with her and have both agreed to stop screening issues over the age of 40. Patient's comfortable with this. 4. Mammography 12/2013. Continue with annual mammography. SBE monthly reviewed. 5. Colonoscopy 2014. Repeat at their recommended interval. 6. Health maintenance. No blood work done as she reports this done at her primary physician's office. Followup one year, sooner as needed.   Note: This document was prepared with digital dictation and possible smart phrase technology. Any transcriptional errors that result from this process are unintentional.   Anastasio Auerbach MD, 10:45 AM 03/11/2014

## 2014-03-12 LAB — URINALYSIS W MICROSCOPIC + REFLEX CULTURE
BILIRUBIN URINE: NEGATIVE
Casts: NONE SEEN
Crystals: NONE SEEN
Glucose, UA: NEGATIVE mg/dL
Ketones, ur: NEGATIVE mg/dL
LEUKOCYTES UA: NEGATIVE
Nitrite: NEGATIVE
PROTEIN: NEGATIVE mg/dL
SQUAMOUS EPITHELIAL / LPF: NONE SEEN
Specific Gravity, Urine: 1.012 (ref 1.005–1.030)
UROBILINOGEN UA: 0.2 mg/dL (ref 0.0–1.0)
pH: 5.5 (ref 5.0–8.0)

## 2014-04-01 ENCOUNTER — Other Ambulatory Visit (INDEPENDENT_AMBULATORY_CARE_PROVIDER_SITE_OTHER): Payer: Medicare Other

## 2014-04-01 DIAGNOSIS — E785 Hyperlipidemia, unspecified: Secondary | ICD-10-CM

## 2014-04-01 DIAGNOSIS — Z0000 Encounter for general adult medical examination without abnormal findings: Secondary | ICD-10-CM

## 2014-04-01 DIAGNOSIS — I10 Essential (primary) hypertension: Secondary | ICD-10-CM

## 2014-04-01 LAB — LIPID PANEL
CHOL/HDL RATIO: 5
CHOLESTEROL: 254 mg/dL — AB (ref 0–200)
HDL: 51.3 mg/dL (ref >39.00)
LDL CALC: 178 mg/dL — AB (ref 0–99)
NONHDL: 202.7
Triglycerides: 126 mg/dL (ref 0.0–149.0)
VLDL: 25.2 mg/dL (ref 0.0–40.0)

## 2014-04-01 LAB — CBC WITH DIFFERENTIAL/PLATELET
BASOS PCT: 0.5 % (ref 0.0–3.0)
Basophils Absolute: 0 10*3/uL (ref 0.0–0.1)
Eosinophils Absolute: 0.1 10*3/uL (ref 0.0–0.7)
Eosinophils Relative: 1.1 % (ref 0.0–5.0)
HEMATOCRIT: 44 % (ref 36.0–46.0)
Hemoglobin: 14.8 g/dL (ref 12.0–15.0)
Lymphocytes Relative: 33.1 % (ref 12.0–46.0)
Lymphs Abs: 1.9 10*3/uL (ref 0.7–4.0)
MCHC: 33.6 g/dL (ref 30.0–36.0)
MCV: 96.9 fl (ref 78.0–100.0)
MONO ABS: 0.3 10*3/uL (ref 0.1–1.0)
Monocytes Relative: 5.7 % (ref 3.0–12.0)
Neutro Abs: 3.4 10*3/uL (ref 1.4–7.7)
Neutrophils Relative %: 59.6 % (ref 43.0–77.0)
PLATELETS: 229 10*3/uL (ref 150.0–400.0)
RBC: 4.54 Mil/uL (ref 3.87–5.11)
RDW: 12.4 % (ref 11.5–15.5)
WBC: 5.7 10*3/uL (ref 4.0–10.5)

## 2014-04-01 LAB — BASIC METABOLIC PANEL
BUN: 11 mg/dL (ref 6–23)
CO2: 26 mEq/L (ref 19–32)
Calcium: 9.1 mg/dL (ref 8.4–10.5)
Chloride: 103 mEq/L (ref 96–112)
Creatinine, Ser: 0.8 mg/dL (ref 0.4–1.2)
GFR: 72.86 mL/min (ref >60.00)
Glucose, Bld: 78 mg/dL (ref 70–99)
Potassium: 4.6 mEq/L (ref 3.5–5.1)
SODIUM: 138 meq/L (ref 135–145)

## 2014-04-01 LAB — HEPATIC FUNCTION PANEL
ALBUMIN: 3.9 g/dL (ref 3.5–5.2)
ALT: 15 U/L (ref 0–35)
AST: 22 U/L (ref 0–37)
Alkaline Phosphatase: 55 U/L (ref 39–117)
BILIRUBIN TOTAL: 0.6 mg/dL (ref 0.2–1.2)
Bilirubin, Direct: 0.1 mg/dL (ref 0.0–0.3)
Total Protein: 7 g/dL (ref 6.0–8.3)

## 2014-04-01 LAB — TSH: TSH: 2.7 u[IU]/mL (ref 0.35–4.50)

## 2014-04-08 ENCOUNTER — Encounter: Payer: Self-pay | Admitting: Internal Medicine

## 2014-04-08 ENCOUNTER — Ambulatory Visit (INDEPENDENT_AMBULATORY_CARE_PROVIDER_SITE_OTHER): Payer: Medicare Other | Admitting: Internal Medicine

## 2014-04-08 VITALS — BP 110/70 | HR 77 | Temp 99.4°F | Resp 18 | Ht 63.75 in | Wt 132.0 lb

## 2014-04-08 DIAGNOSIS — R030 Elevated blood-pressure reading, without diagnosis of hypertension: Secondary | ICD-10-CM

## 2014-04-08 DIAGNOSIS — I5181 Takotsubo syndrome: Secondary | ICD-10-CM

## 2014-04-08 DIAGNOSIS — Z0000 Encounter for general adult medical examination without abnormal findings: Secondary | ICD-10-CM

## 2014-04-08 DIAGNOSIS — Z87442 Personal history of urinary calculi: Secondary | ICD-10-CM

## 2014-04-08 DIAGNOSIS — I10 Essential (primary) hypertension: Secondary | ICD-10-CM

## 2014-04-08 DIAGNOSIS — M949 Disorder of cartilage, unspecified: Secondary | ICD-10-CM

## 2014-04-08 DIAGNOSIS — M899 Disorder of bone, unspecified: Secondary | ICD-10-CM

## 2014-04-08 DIAGNOSIS — E785 Hyperlipidemia, unspecified: Secondary | ICD-10-CM

## 2014-04-08 MED ORDER — ATORVASTATIN CALCIUM 20 MG PO TABS: 20.0000 mg | ORAL_TABLET | Freq: Every day | ORAL | Status: DC

## 2014-04-08 NOTE — Patient Instructions (Addendum)
Limit your sodium (Salt) intake    It is important that you exercise regularly, at least 20 minutes 3 to 4 times per week.  If you develop chest pain or shortness of breath seek  medical attention.  Return in one year for follow-up Health Maintenance Adopting a healthy lifestyle and getting preventive care can go a long way to promote health and wellness. Talk with your health care provider about what schedule of regular examinations is right for you. This is a good chance for you to check in with your provider about disease prevention and staying healthy. In between checkups, there are plenty of things you can do on your own. Experts have done a lot of research about which lifestyle changes and preventive measures are most likely to keep you healthy. Ask your health care provider for more information. WEIGHT AND DIET  Eat a healthy diet  Be sure to include plenty of vegetables, fruits, low-fat dairy products, and lean protein.  Do not eat a lot of foods high in solid fats, added sugars, or salt.  Get regular exercise. This is one of the most important things you can do for your health.  Most adults should exercise for at least 150 minutes each week. The exercise should increase your heart rate and make you sweat (moderate-intensity exercise).  Most adults should also do strengthening exercises at least twice a week. This is in addition to the moderate-intensity exercise.  Maintain a healthy weight  Body mass index (BMI) is a measurement that can be used to identify possible weight problems. It estimates body fat based on height and weight. Your health care provider can help determine your BMI and help you achieve or maintain a healthy weight.  For females 61 years of age and older:   A BMI below 18.5 is considered underweight.  A BMI of 18.5 to 24.9 is normal.  A BMI of 25 to 29.9 is considered overweight.  A BMI of 30 and above is considered obese.  Watch levels of cholesterol  and blood lipids  You should start having your blood tested for lipids and cholesterol at 72 years of age, then have this test every 5 years.  You may need to have your cholesterol levels checked more often if:  Your lipid or cholesterol levels are high.  You are older than 72 years of age.  You are at high risk for heart disease.  CANCER SCREENING   Lung Cancer  Lung cancer screening is recommended for adults 43-41 years old who are at high risk for lung cancer because of a history of smoking.  A yearly low-dose CT scan of the lungs is recommended for people who:  Currently smoke.  Have quit within the past 15 years.  Have at least a 30-pack-year history of smoking. A pack year is smoking an average of one pack of cigarettes a day for 1 year.  Yearly screening should continue until it has been 15 years since you quit.  Yearly screening should stop if you develop a health problem that would prevent you from having lung cancer treatment.  Breast Cancer  Practice breast self-awareness. This means understanding how your breasts normally appear and feel.  It also means doing regular breast self-exams. Let your health care provider know about any changes, no matter how small.  If you are in your 20s or 30s, you should have a clinical breast exam (CBE) by a health care provider every 1-3 years as part of a regular  health exam.  If you are 40 or older, have a CBE every year. Also consider having a breast X-ray (mammogram) every year.  If you have a family history of breast cancer, talk to your health care provider about genetic screening.  If you are at high risk for breast cancer, talk to your health care provider about having an MRI and a mammogram every year.  Breast cancer gene (BRCA) assessment is recommended for women who have family members with BRCA-related cancers. BRCA-related cancers include:  Breast.  Ovarian.  Tubal.  Peritoneal cancers.  Results of the  assessment will determine the need for genetic counseling and BRCA1 and BRCA2 testing. Cervical Cancer Routine pelvic examinations to screen for cervical cancer are no longer recommended for nonpregnant women who are considered low risk for cancer of the pelvic organs (ovaries, uterus, and vagina) and who do not have symptoms. A pelvic examination may be necessary if you have symptoms including those associated with pelvic infections. Ask your health care provider if a screening pelvic exam is right for you.   The Pap test is the screening test for cervical cancer for women who are considered at risk.  If you had a hysterectomy for a problem that was not cancer or a condition that could lead to cancer, then you no longer need Pap tests.  If you are older than 65 years, and you have had normal Pap tests for the past 10 years, you no longer need to have Pap tests.  If you have had past treatment for cervical cancer or a condition that could lead to cancer, you need Pap tests and screening for cancer for at least 20 years after your treatment.  If you no longer get a Pap test, assess your risk factors if they change (such as having a new sexual partner). This can affect whether you should start being screened again.  Some women have medical problems that increase their chance of getting cervical cancer. If this is the case for you, your health care provider may recommend more frequent screening and Pap tests.  The human papillomavirus (HPV) test is another test that may be used for cervical cancer screening. The HPV test looks for the virus that can cause cell changes in the cervix. The cells collected during the Pap test can be tested for HPV.  The HPV test can be used to screen women 11 years of age and older. Getting tested for HPV can extend the interval between normal Pap tests from three to five years.  An HPV test also should be used to screen women of any age who have unclear Pap test  results.  After 72 years of age, women should have HPV testing as often as Pap tests.  Colorectal Cancer  This type of cancer can be detected and often prevented.  Routine colorectal cancer screening usually begins at 72 years of age and continues through 72 years of age.  Your health care provider may recommend screening at an earlier age if you have risk factors for colon cancer.  Your health care provider may also recommend using home test kits to check for hidden blood in the stool.  A small camera at the end of a tube can be used to examine your colon directly (sigmoidoscopy or colonoscopy). This is done to check for the earliest forms of colorectal cancer.  Routine screening usually begins at age 67.  Direct examination of the colon should be repeated every 5-10 years through 72 years  of age. However, you may need to be screened more often if early forms of precancerous polyps or small growths are found. Skin Cancer  Check your skin from head to toe regularly.  Tell your health care provider about any new moles or changes in moles, especially if there is a change in a mole's shape or color.  Also tell your health care provider if you have a mole that is larger than the size of a pencil eraser.  Always use sunscreen. Apply sunscreen liberally and repeatedly throughout the day.  Protect yourself by wearing long sleeves, pants, a wide-brimmed hat, and sunglasses whenever you are outside. HEART DISEASE, DIABETES, AND HIGH BLOOD PRESSURE   Have your blood pressure checked at least every 1-2 years. High blood pressure causes heart disease and increases the risk of stroke.  If you are between 71 years and 25 years old, ask your health care provider if you should take aspirin to prevent strokes.  Have regular diabetes screenings. This involves taking a blood sample to check your fasting blood sugar level.  If you are at a normal weight and have a low risk for diabetes, have this  test once every three years after 72 years of age.  If you are overweight and have a high risk for diabetes, consider being tested at a younger age or more often. PREVENTING INFECTION  Hepatitis B  If you have a higher risk for hepatitis B, you should be screened for this virus. You are considered at high risk for hepatitis B if:  You were born in a country where hepatitis B is common. Ask your health care provider which countries are considered high risk.  Your parents were born in a high-risk country, and you have not been immunized against hepatitis B (hepatitis B vaccine).  You have HIV or AIDS.  You use needles to inject street drugs.  You live with someone who has hepatitis B.  You have had sex with someone who has hepatitis B.  You get hemodialysis treatment.  You take certain medicines for conditions, including cancer, organ transplantation, and autoimmune conditions. Hepatitis C  Blood testing is recommended for:  Everyone born from [birth year] through 1964.  Anyone with known risk factors for hepatitis C. Sexually transmitted infections (STIs)  You should be screened for sexually transmitted infections (STIs) including gonorrhea and chlamydia if:  You are sexually active and are younger than 72 years of age.  You are older than 72 years of age and your health care provider tells you that you are at risk for this type of infection.  Your sexual activity has changed since you were last screened and you are at an increased risk for chlamydia or gonorrhea. Ask your health care provider if you are at risk.  If you do not have HIV, but are at risk, it may be recommended that you take a prescription medicine daily to prevent HIV infection. This is called pre-exposure prophylaxis (PrEP). You are considered at risk if:  You are sexually active and do not regularly use condoms or know the HIV status of your partner(s).  You take drugs by injection.  You are sexually active with  a partner who has HIV. Talk with your health care provider about whether you are at high risk of being infected with HIV. If you choose to begin PrEP, you should first be tested for HIV. You should then be tested every 3 months for as long as you are taking PrEP.  PREGNANCY  If you are premenopausal and you may become pregnant, ask your health care provider about preconception counseling.  If you may become pregnant, take 400 to 800 micrograms (mcg) of folic acid every day.  If you want to prevent pregnancy, talk to your health care provider about birth control (contraception). OSTEOPOROSIS AND MENOPAUSE   Osteoporosis is a disease in which the bones lose minerals and strength with aging. This can result in serious bone fractures. Your risk for osteoporosis can be identified using a bone density scan.  If you are 52 years of age or older, or if you are at risk for osteoporosis and fractures, ask your health care provider if you should be screened.  Ask your health care provider whether you should take a calcium or vitamin D supplement to lower your risk for osteoporosis.  Menopause may have certain physical symptoms and risks.  Hormone replacement therapy may reduce some of these symptoms and risks. Talk to your health care provider about whether hormone replacement therapy is right for you.  HOME CARE INSTRUCTIONS   Schedule regular health, dental, and eye exams.  Stay current with your immunizations.   Do not use any tobacco products including cigarettes, chewing tobacco, or electronic cigarettes.  If you are pregnant, do not drink alcohol.  If you are breastfeeding, limit how much and how often you drink alcohol.  Limit alcohol intake to no more than 1 drink per day for nonpregnant women. One drink equals 12 ounces of beer, 5 ounces of wine, or 1 ounces of hard liquor.  Do not use street drugs.  Do not share needles.  Ask your health care provider for help if you need  support or information about quitting drugs.  Tell your health care provider if you often feel depressed.  Tell your health care provider if you have ever been abused or do not feel safe at home. Document Released: 02/21/2011 Document Revised: 12/23/2013 Document Reviewed: 07/10/2013 [facility] Patient Information 2015 [place], [place]. This information is not intended to replace advice given to you by your health care provider. Make sure you discuss any questions you have with your health care provider. Cardiac Diet This diet can help prevent heart disease and stroke. Many factors influence your heart health, including eating and exercise habits. Coronary risk rises a lot with abnormal blood fat (lipid) levels. Cardiac meal planning includes limiting unhealthy fats, increasing healthy fats, and making other small dietary changes. General guidelines are as follows:  Adjust calorie intake to reach and maintain desirable body weight.  Limit total fat intake to less than 30% of total calories. Saturated fat should be less than 7% of calories.  Saturated fats are found in animal products and in some vegetable products. Saturated vegetable fats are found in coconut oil, cocoa butter, palm oil, and palm kernel oil. Read labels carefully to avoid these products as much as possible. Use butter in moderation. Choose tub margarines and oils that have 2 grams of fat or less. Good cooking oils are canola and olive oils.  Practice low-fat cooking techniques. Do not fry food. Instead, broil, bake, boil, steam, grill, roast on a rack, stir-fry, or microwave it. Other fat reducing suggestions include:  Remove the skin from poultry.  Remove all visible fat from meats.  Skim the fat off stews, soups, and gravies before serving them.  Steam vegetables in water or broth instead of sauting them in fat.  Avoid foods with trans fat (or hydrogenated oils), such as commercially fried foods  and commercially baked  goods. Commercial shortening and deep-frying fats will contain trans fat.  Increase intake of fruits, vegetables, whole grains, and legumes to replace foods high in fat.  Increase consumption of nuts, legumes, and seeds to at least 4 servings weekly. One serving of a legume equals  cup, and 1 serving of nuts or seeds equals  cup.  Choose whole grains more often. Have 3 servings per day (a serving is 1 ounce [oz]).  Eat 4 to 5 servings of vegetables per day. A serving of vegetables is 1 cup of raw leafy vegetables;  cup of raw or cooked cut-up vegetables;  cup of vegetable juice.  Eat 4 to 5 servings of fruit per day. A serving of fruit is 1 medium whole fruit;  cup of dried fruit;  cup of fresh, frozen, or canned fruit;  cup of 100% fruit juice.  Increase your intake of dietary fiber to 20 to 30 grams per day. Insoluble fiber may help lower your risk of heart disease and may help curb your appetite.  Soluble fiber binds cholesterol to be removed from the blood. Foods high in soluble fiber are dried beans, citrus fruits, oats, apples, bananas, broccoli, Brussels sprouts, and eggplant.  Try to include foods fortified with plant sterols or stanols, such as yogurt, breads, juices, or margarines. Choose several fortified foods to achieve a daily intake of 2 to 3 grams of plant sterols or stanols.  Foods with omega-3 fats can help reduce your risk of heart disease. Aim to have a 3.5 oz portion of fatty fish twice per week, such as salmon, mackerel, albacore tuna, sardines, lake trout, or herring. If you wish to take a fish oil supplement, choose one that contains 1 gram of both DHA and EPA.  Limit processed meats to 2 servings (3 oz portion) weekly.  Limit the sodium in your diet to 1500 milligrams (mg) per day. If you have high blood pressure, talk to a registered dietitian about a DASH (Dietary Approaches to Stop Hypertension) eating plan.  Limit sweets and beverages with added sugar, such  as soda, to no more than 5 servings per week. One serving is:   1 tablespoon sugar.  1 tablespoon jelly or jam.   cup sorbet.  1 cup lemonade.   cup regular soda. CHOOSING FOODS Starches  Allowed: Breads: All kinds (wheat, rye, raisin, white, oatmeal, [place], [place], and English muffin bread). Low-fat rolls: English muffins, frankfurter and hamburger buns, bagels, pita bread, tortillas (not fried). Pancakes, waffles, biscuits, and muffins made with recommended oil.  Avoid: Products made with saturated or trans fats, oils, or whole milk products. Butter rolls, cheese breads, croissants. Commercial doughnuts, muffins, sweet rolls, biscuits, waffles, pancakes, store-bought mixes. Crackers  Allowed: Low-fat crackers and snacks: Animal, graham, rye, saltine (with recommended oil, no lard), oyster, and matzo crackers. Bread sticks, melba toast, rusks, flatbread, pretzels, and light popcorn.  Avoid: High-fat crackers: cheese crackers, butter crackers, and those made with coconut, palm oil, or trans fat (hydrogenated oils). Buttered popcorn. Cereals  Allowed: Hot or cold whole-grain cereals.  Avoid: Cereals containing coconut, hydrogenated vegetable fat, or animal fat. Potatoes / Pasta / Rice  Allowed: All kinds of potatoes, rice, and pasta (such as macaroni, spaghetti, and noodles).  Avoid: Pasta or rice prepared with cream sauce or high-fat cheese. Chow mein noodles, [place] fries. Vegetables  Allowed: All vegetables and vegetable juices.  Avoid: Fried vegetables. Vegetables in cream, butter, or high-fat cheese sauces. Limit coconut. Fruit in cream  or custard. Protein  Allowed: Limit your intake of meat, seafood, and poultry to no more than 6 oz (cooked weight) per day. All lean, well-trimmed beef, veal, pork, and lamb. All chicken and [place] without skin. All fish and shellfish. Wild game: wild duck, rabbit, pheasant, and venison. Egg whites or low-cholesterol egg substitutes may  be used as desired. Meatless dishes: recipes with dried beans, peas, lentils, and tofu (soybean curd). Seeds and nuts: all seeds and most nuts.  Avoid: Prime grade and other heavily marbled and fatty meats, such as short ribs, spare ribs, rib eye roast or steak, frankfurters, sausage, bacon, and high-fat luncheon meats, mutton. Caviar. Commercially fried fish. Domestic duck, goose, venison sausage. Organ meats: liver, gizzard, heart, chitterlings, brains, kidney, sweetbreads. Dairy  Allowed: Low-fat cheeses: nonfat or low-fat cottage cheese (1% or 2% fat), cheeses made with part skim milk, such as mozzarella, farmers, string, or ricotta. (Cheeses should be labeled no more than 2 to 6 grams fat per oz.). Skim (or 1%) milk: liquid, powdered, or evaporated. Buttermilk made with low-fat milk. Drinks made with skim or low-fat milk or cocoa. Chocolate milk or cocoa made with skim or low-fat (1%) milk. Nonfat or low-fat yogurt.  Avoid: Whole milk cheeses, including colby, cheddar, [place], Monterey Jack, [place], [place], [place], American, Swiss, and blue. Creamed cottage cheese, cream cheese. Whole milk and whole milk products, including buttermilk or yogurt made from whole milk, drinks made from whole milk. Condensed milk, evaporated whole milk, and 2% milk. Soups and Combination Foods  Allowed: Low-fat low-sodium soups: broth, dehydrated soups, homemade broth, soups with the fat removed, homemade cream soups made with skim or low-fat milk. Low-fat spaghetti, lasagna, chili, and Spanish rice if low-fat ingredients and low-fat cooking techniques are used.  Avoid: Cream soups made with whole milk, cream, or high-fat cheese. All other soups. Desserts and Sweets  Allowed: Sherbet, fruit ices, gelatins, meringues, and angel food cake. Homemade desserts with recommended fats, oils, and milk products. Jam, jelly, honey, marmalade, sugars, and syrups. Pure sugar candy, such as gum drops, hard candy, jelly beans,  marshmallows, mints, and small amounts of dark chocolate.  Avoid: Commercially prepared cakes, pies, cookies, frosting, pudding, or mixes for these products. Desserts containing whole milk products, chocolate, coconut, lard, palm oil, or palm kernel oil. Ice cream or ice cream drinks. Candy that contains chocolate, coconut, butter, hydrogenated fat, or unknown ingredients. Buttered syrups. Fats and Oils  Allowed: Vegetable oils: safflower, sunflower, corn, soybean, cottonseed, sesame, canola, olive, or peanut. Non-hydrogenated margarines. Salad dressing or mayonnaise: homemade or commercial, made with a recommended oil. Low or nonfat salad dressing or mayonnaise.  Limit added fats and oils to 6 to 8 tsp per day (includes fats used in cooking, baking, salads, and spreads on bread). Remember to count the "hidden fats" in foods.  Avoid: Solid fats and shortenings: butter, lard, salt pork, bacon drippings. Gravy containing meat fat, shortening, or suet. Cocoa butter, coconut. Coconut oil, palm oil, palm kernel oil, or hydrogenated oils: these ingredients are often used in bakery products, nondairy creamers, whipped toppings, candy, and commercially fried foods. Read labels carefully. Salad dressings made of unknown oils, sour cream, or cheese, such as blue cheese and Roquefort. Cream, all kinds: half-and-half, light, heavy, or whipping. Sour cream or cream cheese (even if "light" or low-fat). Nondairy cream substitutes: coffee creamers and sour cream substitutes made with palm, palm kernel, hydrogenated oils, or coconut oil. Beverages  Allowed: Coffee (regular or decaffeinated), tea. Diet carbonated beverages, mineral  water. Alcohol: Check with your caregiver. Moderation is recommended.  Avoid: Whole milk, regular sodas, and juice drinks with added sugar. Condiments  Allowed: All seasonings and condiments. Cocoa powder. "Cream" sauces made with recommended ingredients.  Avoid: Carob powder made with  hydrogenated fats. SAMPLE MENU Breakfast   cup orange juice   cup oatmeal  1 slice toast  1 tsp margarine  1 cup skim milk Lunch  [place] sandwich with 2 oz [place], 2 slices bread  Lettuce and tomato slices  Fresh fruit  Carrot sticks  Coffee or tea Snack  Fresh fruit or low-fat crackers Dinner  3 oz lean ground beef  1 baked potato  1 tsp margarine   cup asparagus  Lettuce salad  1 tbs non-creamy dressing   cup peach slices  1 cup skim milk Document Released: 05/17/2008 Document Revised: 02/07/2012 Document Reviewed: 10/08/2013 [employer] Patient Information 2015 [place], [place]. This information is not intended to replace advice given to you by your health care provider. Make sure you discuss any questions you have with your health care provider.

## 2014-04-08 NOTE — Progress Notes (Signed)
Patient ID: Jocelyn Day, female   DOB: Jan 31, 1941, 73 y.o.   MRN: [id number]  Subjective:    Patient ID: Jocelyn Day, female    DOB: 01-16-1941, 73 y.o.   MRN: [id number]  HPI   73 year old patient who is seen today for a preventive health examination.   She is followed by cardiology for Takotsubp CM;  she has done well and her last ejection fraction was apparently 50%. She is also followed by OB/GYN and has had a recent gynecologic evaluation She has a history of anxiety depression since the  death of her husband in 2011/08/22 from esophageal cancer. She has hypertension. She has a long history of dyslipidemia that has been treated with diet only.  Risk and benefits of statin therapy discussed  Medicare wellness exam:  1. Risk factors, based on past  M,S,F history- cardiovascular risk factors include hypertension and mild dyslipidemia. She has a history of acute coronary syndrome and has had a heart catheterization that was free of significant coronary artery disease  2.  Physical activities: No activity restrictions  3.  Depression/mood: History of anxiety depression and grief reaction from the death of her husband  4.  Hearing: No deficits  5.  ADL's: Independent in all aspects of daily living  6.  Fall risk: Low  7.  Home safety: No problems identified  8.  Height weight, and visual acuity; heart weight stable no change in visual acuity  9.  Counseling: Heart healthy diet regular exercise all encouraged  10. Lab orders based on risk factors: Laboratory profile reviewed 11. Referral : Followup cardiology  12. Care plan: Continue present regimen. 13. Cognitive assessment: Alert and oriented normal affect. No cognitive dysfunction.  14.  Preventive services will include annual mammogram.  She has an annual gynecologic visit.colonoscopy performed 2014  15.  Provider list includes cardiology OB/GYN and GI   Past Medical History  Diagnosis Date  . ACS (acute coronary  syndrome) 10/20/2010    Normal coronaries per cath 2/12  . Takotsubo cardiomyopathy 10/20/2010    EF of 30% per cath 2/12, EF is 50% as if 4/12  . Calcium oxalate renal stones   . Osteoporosis   . Atrophic vaginitis   . Insomnia   . Takotsubo cardiomyopathy 10/21/2010    Acute coronary syndrome/Takotsubo cardiomyopathy  . Cancer     skin cancer-basal cell    History   Social History  . Marital Status: Married    Spouse Name: N/A    Number of Children: N/A  . Years of Education: N/A   Occupational History  . Not on file.   Social History Main Topics  . Smoking status: Never Smoker   . Smokeless tobacco: Never Used  . Alcohol Use: Yes     Comment: once or twice  a month a glasses of wine  . Drug Use: No  . Sexual Activity: No   Other Topics Concern  . Not on file   Social History Narrative  . No narrative on file    Past Surgical History  Procedure Laterality Date  . Cholecystectomy  1993  . Cesarean section  1974  . Cesarean section  1977  . Dilation and curettage of uterus  1990  . Kidney stone surgery  2010  . 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 Basal cell carinoma excised  2005  . Colonoscopy    .  Polypectomy      Family History  Problem Relation Age of Onset  . Hypertension Mother   . Cancer Father     prostate  . Heart disease Neg Hx   . Colon cancer Neg Hx   . Diabetes Brother     liver translplant and medication induced the diabetes  . Diabetes Paternal Uncle   . Diabetes Cousin     No Known Allergies  Current Outpatient Prescriptions on File Prior to Visit  Medication Sig Dispense Refill  . aspirin 81 MG tablet Take 81 mg by mouth daily.        . Calcium Carb-Cholecalciferol (CALCIUM PLUS VITAMIN D3 PO) Take by mouth 2 (two) times daily.        . carvedilol (COREG) 12.5 MG tablet One 12.5 mg tablet twice  daily  180 tablet  2  . diphenhydrAMINE (BENADRYL ALLERGY) 25 mg capsule Take 25 mg by mouth every 6 (six) hours as needed for itching.      [name] lisinopril (PRINIVIL,ZESTRIL) 5 MG tablet Take 1 tablet (5 mg total) by mouth daily.  90 tablet  3   No current facility-administered medications on file prior to visit.    There were no vitals taken for this visit.      Review of Systems  Constitutional: Negative for fever, appetite change, fatigue and unexpected weight change.  HENT: Negative for congestion, dental problem, ear pain, hearing loss, mouth sores, nosebleeds, sinus pressure, sore throat, tinnitus, trouble swallowing and voice change.   Eyes: Negative for photophobia, pain, redness and visual disturbance.  Respiratory: Negative for cough, chest tightness and shortness of breath.   Cardiovascular: Negative for chest pain, palpitations and leg swelling.  Gastrointestinal: Negative for nausea, vomiting, abdominal pain, diarrhea, constipation, blood in stool, abdominal distention and rectal pain.  Genitourinary: Negative for dysuria, urgency, frequency, hematuria, flank pain, vaginal bleeding, vaginal discharge, difficulty urinating, genital sores, vaginal pain, menstrual problem and pelvic pain.  Musculoskeletal: Negative for arthralgias, back pain and neck stiffness.  Skin: Negative for rash.  Neurological: Negative for dizziness, syncope, speech difficulty, weakness, light-headedness, numbness and headaches.  Hematological: Negative for adenopathy. Does not bruise/bleed easily.  Psychiatric/Behavioral: Negative for suicidal ideas, behavioral problems, self-injury, dysphoric mood and agitation. The patient is nervous/anxious.        Objective:   Physical Exam  Constitutional: She is oriented to person, place, and time. She appears well-developed and well-nourished.  HENT:  Head: Normocephalic and atraumatic.  Right Ear: External ear normal.  Left Ear: External ear normal.   Mouth/Throat: Oropharynx is clear and moist.  Eyes: Conjunctivae and EOM are normal.  Neck: Normal range of motion. Neck supple. No JVD present. No thyromegaly present.  Cardiovascular: Normal rate, regular rhythm, normal heart sounds and intact distal pulses.   No murmur heard. Pulmonary/Chest: Effort normal and breath sounds normal. She has no wheezes. She has no rales.  Abdominal: Soft. Bowel sounds are normal. She exhibits no distension and no mass. There is no tenderness. There is no rebound and no guarding.  Musculoskeletal: Normal range of motion. She exhibits no edema and no tenderness.  Neurological: She is alert and oriented to person, place, and time. She has normal reflexes. No cranial nerve deficit. She exhibits normal muscle tone. Coordination normal.  Skin: Skin is warm and dry. No rash noted.  Psychiatric: She has a normal mood and affect. Her behavior is normal.          Assessment & Plan:   Preventive  health exam Mild hypertension stable Dyslipidemia.  Risk and benefits of statin therapy discussed.  Will begin atorvastatin 20 mg daily Return here in one year

## 2014-04-08 NOTE — Progress Notes (Signed)
Pre visit review using our clinic review tool, if applicable. No additional management support is needed unless otherwise documented below in the visit note. 

## 2014-04-09 ENCOUNTER — Telehealth: Payer: Self-pay | Admitting: Internal Medicine

## 2014-04-09 NOTE — Telephone Encounter (Signed)
Relevant patient education mailed to patient.  

## 2014-04-14 ENCOUNTER — Telehealth: Payer: Self-pay | Admitting: Internal Medicine

## 2014-04-14 NOTE — Telephone Encounter (Signed)
Spoke to pt told her there are no good substitutes for a statin drug, okay to discontinue Atorvastatin if you have safety concerns and will readdress at next office visit per Dr. Raliegh Ip. Told pt she can make an appointment to discuss medication with Dr. Raliegh Ip. Pt verbalized understanding and stated no I will just take the medication till I come in next year and will discuss then. Told pt okay, but if any problems please call. Pt verbalized understanding.

## 2014-04-14 NOTE — Telephone Encounter (Signed)
Please see message and advise 

## 2014-04-14 NOTE — Telephone Encounter (Signed)
There are no good substitutes for a statin drug;  okay to discontinue atorvastatin if patient has safety concerns and will readdress at her next office visit;  please send information concerning a heart healthy diet

## 2014-04-14 NOTE — Telephone Encounter (Signed)
Pt  States she was put on Zocor alast week  (.atorvastatin (LIPITOR) 20 MG tablet)  has been reading about this med.  Pt is uncomfortable taking and want to know if there is something else generic that is not a statin drug. [facility] [employer]

## 2014-04-15 ENCOUNTER — Telehealth: Payer: Self-pay | Admitting: Cardiovascular Disease

## 2014-04-15 NOTE — Telephone Encounter (Signed)
Spoke with patient who states her PCP checked her cholesterol recently which was elevated and advised her to start Atorvastatin 20 mg once daily.  Patient would like to know Dr. Elmarie Shiley preference for cholesterol-lowering medications and also what kind of follow-up he orders.  I advised patient that I will discuss with Dr. Acie Fredrickson who is in the office today and will call her back.  I advised her that Dr. Acie Fredrickson orders 3 month f/u lab work when starting a statin to ensure that patient's liver function is normal and that cholesterol is improving and then he follows up yearly.  Patient verbalized understanding and gratitude.

## 2014-04-15 NOTE — Telephone Encounter (Signed)
Agree with trying atorvastatin 20

## 2014-04-15 NOTE — Telephone Encounter (Signed)
New message    Has some questions for the nurse to ask Dr. Acie Fredrickson did not disclose any information.

## 2014-04-15 NOTE — Telephone Encounter (Signed)
Spoke with patient and advised her that Dr. Acie Fredrickson is in agreement with plan from Dr. Burnice Logan.  I advised patient to call back if she has problems or questions and I informed her of our lipid clinic.  Patient verbalized understanding and gratitude.

## 2014-04-19 ENCOUNTER — Encounter: Payer: Self-pay | Admitting: Internal Medicine

## 2014-05-02 ENCOUNTER — Other Ambulatory Visit: Payer: Self-pay | Admitting: Cardiovascular Disease

## 2014-05-14 ENCOUNTER — Other Ambulatory Visit: Payer: Self-pay | Admitting: Dermatology

## 2014-06-22 ENCOUNTER — Other Ambulatory Visit: Payer: Self-pay

## 2014-06-22 DIAGNOSIS — I5181 Takotsubo syndrome: Secondary | ICD-10-CM

## 2014-06-22 MED ORDER — CARVEDILOL 12.5 MG PO TABS: ORAL_TABLET | ORAL | Status: DC

## 2014-06-23 ENCOUNTER — Encounter: Payer: Self-pay | Admitting: Internal Medicine

## 2014-06-24 ENCOUNTER — Other Ambulatory Visit: Payer: Self-pay | Admitting: *Deleted

## 2014-06-24 DIAGNOSIS — I5181 Takotsubo syndrome: Secondary | ICD-10-CM

## 2014-06-24 MED ORDER — CARVEDILOL 12.5 MG PO TABS: ORAL_TABLET | ORAL | Status: DC

## 2014-08-01 ENCOUNTER — Other Ambulatory Visit: Payer: Self-pay | Admitting: Cardiovascular Disease

## 2014-10-29 DIAGNOSIS — Z85828 Personal history of other malignant neoplasm of skin: Secondary | ICD-10-CM | POA: Diagnosis not present

## 2014-10-29 DIAGNOSIS — D1801 Hemangioma of skin and subcutaneous tissue: Secondary | ICD-10-CM | POA: Diagnosis not present

## 2014-10-29 DIAGNOSIS — L821 Other seborrheic keratosis: Secondary | ICD-10-CM | POA: Diagnosis not present

## 2014-12-09 ENCOUNTER — Telehealth: Payer: Self-pay | Admitting: *Deleted

## 2014-12-09 NOTE — Telephone Encounter (Signed)
Pt called requesting dexa order faxed to to [facility] scheduled on 01/15/15. This was done, pt aware

## 2015-01-22 ENCOUNTER — Encounter: Payer: Self-pay | Admitting: Gynecology

## 2015-01-22 DIAGNOSIS — M8580 Other specified disorders of bone density and structure, unspecified site: Secondary | ICD-10-CM | POA: Diagnosis not present

## 2015-01-22 DIAGNOSIS — Z1231 Encounter for screening mammogram for malignant neoplasm of breast: Secondary | ICD-10-CM | POA: Diagnosis not present

## 2015-01-27 ENCOUNTER — Encounter: Payer: Self-pay | Admitting: Gynecology

## 2015-01-28 ENCOUNTER — Telehealth: Payer: Self-pay | Admitting: Gynecology

## 2015-01-28 ENCOUNTER — Encounter: Payer: Self-pay | Admitting: Gynecology

## 2015-01-28 NOTE — Telephone Encounter (Signed)
PATIENT INFORMED WITH THE BELOW NOTE

## 2015-01-28 NOTE — Telephone Encounter (Signed)
Outpatient that her bone density did show osteoporosis. Recommend office visit to discuss treatment options.

## 2015-02-02 ENCOUNTER — Other Ambulatory Visit: Payer: Self-pay | Admitting: Cardiovascular Disease

## 2015-02-17 ENCOUNTER — Ambulatory Visit (INDEPENDENT_AMBULATORY_CARE_PROVIDER_SITE_OTHER): Payer: Medicare Other | Admitting: Cardiovascular Disease

## 2015-02-17 ENCOUNTER — Encounter: Payer: Self-pay | Admitting: Cardiovascular Disease

## 2015-02-17 VITALS — BP 124/62 | HR 65 | Ht 63.75 in | Wt 131.0 lb

## 2015-02-17 DIAGNOSIS — I5181 Takotsubo syndrome: Secondary | ICD-10-CM | POA: Diagnosis not present

## 2015-02-17 NOTE — Patient Instructions (Signed)
Medication Instructions:  Your physician recommends that you continue on your current medications as directed. Please refer to the Current Medication list given to you today.   Labwork: None Ordered   Testing/Procedures: None Ordered   Follow-Up: Your physician wants you to follow-up in: 1 year with Dr. Nahser.  You will receive a reminder letter in the mail two months in advance. If you don't receive a letter, please call our office to schedule the follow-up appointment.      

## 2015-02-17 NOTE — Progress Notes (Signed)
Jocelyn Day Date of Birth  [birth year] [facility] [address]    [place]   [place], [place]  [address]    [place], [place]  [address] [phone]  Fax  [phone]  [phone]  Fax [phone]  Problem List: 1.  Takosubo Syndrome - Feb, 2012, EF = 30%, echo in April, 2012 revealed EF of 45-50%,   EF 60% in August 2013.    History of Present Illness:  Jocelyn Day is a 73 yo with a hx of Takosubo Syndrome.  She has not had any recurrent chest pain.  Her husband died of esophageal cancer this past December. She is exercising some.    Jan 18, 2013:  She is doing well.    No CP. No dyspnea.    January 20, 2014:  Jocelyn Day is doing well.  Has had some shoulder problems.   February 17, 2015:  Jocelyn Day is doing well. No episodes of CP , no dyspnea , Had some nausea and dizziness during a Tai Chi work out    Current Outpatient Prescriptions on File Prior to Visit  Medication Sig Dispense Refill  . aspirin 81 MG tablet Take 81 mg by mouth daily.      [name] atorvastatin (LIPITOR) 20 MG tablet Take 1 tablet (20 mg total) by mouth daily. 90 tablet 3  . Calcium Carb-Cholecalciferol (CALCIUM PLUS VITAMIN D3 PO) Take by mouth 2 (two) times daily.      . diphenhydrAMINE (BENADRYL ALLERGY) 25 mg capsule Take 25 mg by mouth every 6 (six) hours as needed for itching.    [name] lisinopril (PRINIVIL,ZESTRIL) 5 MG tablet TAKE 1 TABLET BY MOUTH EVERY DAY 90 tablet 0   No current facility-administered medications on file prior to visit.    No Known Allergies  Past Medical History  Diagnosis Date  . ACS (acute coronary syndrome) 10/20/2010    Normal coronaries per cath 2/12  . Takotsubo cardiomyopathy 10/20/2010    EF of 30% per cath 2/12, EF is 50% as if 4/12  . Calcium oxalate renal stones   . Osteoporosis 01/2015    T score -2.5  . Atrophic vaginitis   . Insomnia   . Takotsubo cardiomyopathy 10/21/2010    Acute coronary syndrome/Takotsubo cardiomyopathy   . Cancer     skin cancer-basal cell    Past Surgical History  Procedure Laterality Date  . Cholecystectomy  1993  . Cesarean section  1974  . Cesarean section  1977  . Dilation and curettage of uterus  1990  . Kidney stone surgery  2010  . 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 Basal cell carinoma excised  2005  . Colonoscopy    . Polypectomy      History  Smoking status  . Never Smoker   Smokeless tobacco  . Never Used    History  Alcohol Use  . Yes    Comment: once or twice  a month a glasses of wine    Family History  Problem Relation Age of Onset  . Hypertension Mother   . Cancer Father     prostate  . Heart disease Neg Hx   . Colon cancer Neg Hx   . Diabetes Brother     liver translplant and medication induced the diabetes  . Diabetes Paternal Uncle   . Diabetes  Cousin     Reviw of Systems:  Reviewed in the HPI.  All other systems are negative.  Physical Exam: Blood pressure 124/62, pulse 65, height 5' 3.75" (1.619 m), weight 59.421 kg (131 lb). General: Well developed, well nourished, in no acute distress.  Head: Normocephalic, atraumatic, sclera non-icteric, mucus membranes are moist,   Neck: Supple. Negative for carotid bruits. JVD not elevated.  Lungs: Clear bilaterally to auscultation without wheezes, rales, or rhonchi. Breathing is unlabored.  Heart: RRR with S1 S2. No murmurs, rubs, or gallops appreciated.  Abdomen: Soft, non-tender, non-distended with normoactive bowel sounds. No hepatomegaly. No rebound/guarding. No obvious abdominal masses.  Msk:  Strength and tone appear normal for age.  Extremities: No clubbing or cyanosis. No edema.  Distal pedal pulses are 2+ and equal bilaterally.  Neuro: Alert and oriented X 3. Moves all extremities spontaneously.  Psych:  Responds to questions appropriately with  a normal affect.  ECG: February 17, 2015:  NSR at 77,  Sinus arrhythmia.   Assessment / Plan:   1.  Takosubo Syndrome - Feb, 2012, EF = 30%, echo in April, 2012 revealed EF of 45-50%,  EF is now 60% by echo She is completely asymptomatic from a cardiac standpoint .  Her last EF was normal .   She is doing well .    Continue same meds.  I will see her in 1 year   Janice Seales, Wonda Cheng, MD  02/17/2015 4:05 PM    [facility] [place],  [place], [place]  [address] Pager [phone] Phone: [phone]; Fax: [phone]   [facility]  [address] [place], [place]  [address] [phone]    Fax [phone]

## 2015-03-16 ENCOUNTER — Other Ambulatory Visit: Payer: Self-pay | Admitting: Internal Medicine

## 2015-03-20 ENCOUNTER — Ambulatory Visit (INDEPENDENT_AMBULATORY_CARE_PROVIDER_SITE_OTHER): Payer: Medicare Other | Admitting: Gynecology

## 2015-03-20 ENCOUNTER — Telehealth: Payer: Self-pay | Admitting: *Deleted

## 2015-03-20 ENCOUNTER — Encounter: Payer: Self-pay | Admitting: Gynecology

## 2015-03-20 VITALS — BP 120/74 | Ht 64.0 in | Wt 131.0 lb

## 2015-03-20 DIAGNOSIS — Z01419 Encounter for gynecological examination (general) (routine) without abnormal findings: Secondary | ICD-10-CM | POA: Diagnosis not present

## 2015-03-20 DIAGNOSIS — N952 Postmenopausal atrophic vaginitis: Secondary | ICD-10-CM

## 2015-03-20 DIAGNOSIS — M810 Age-related osteoporosis without current pathological fracture: Secondary | ICD-10-CM | POA: Diagnosis not present

## 2015-03-20 MED ORDER — ALENDRONATE SODIUM 70 MG PO TABS: 70.0000 mg | ORAL_TABLET | ORAL | Status: DC

## 2015-03-20 NOTE — Telephone Encounter (Signed)
Pt informed with call on 03/23/15 @ 10:30am order placed

## 2015-03-20 NOTE — Patient Instructions (Signed)
Alendronate tablets What is this medicine? ALENDRONATE (a LEN droe nate) slows calcium loss from bones. It helps to make normal healthy bone and to slow bone loss in people with Paget's disease and osteoporosis. It may be used in others at risk for bone loss. This medicine may be used for other purposes; ask your health care provider or pharmacist if you have questions. COMMON BRAND NAME(S): Fosamax What should I tell my health care provider before I take this medicine? They need to know if you have any of these conditions: -dental disease -esophagus, stomach, or intestine problems, like acid reflux or GERD -kidney disease -low blood calcium -low vitamin D -problems sitting or standing 30 minutes -trouble swallowing -an unusual or allergic reaction to alendronate, other medicines, foods, dyes, or preservatives -pregnant or trying to get pregnant -breast-feeding How should I use this medicine? You must take this medicine exactly as directed or you will lower the amount of the medicine you absorb into your body or you may cause yourself harm. Take this medicine by mouth first thing in the morning, after you are up for the day. Do not eat or drink anything before you take your medicine. Swallow the tablet with a full glass (6 to 8 fluid ounces) of plain water. Do not take this medicine with any other drink. Do not chew or crush the tablet. After taking this medicine, do not eat breakfast, drink, or take any medicines or vitamins for at least 30 minutes. Sit or stand up for at least 30 minutes after you take this medicine; do not lie down. Do not take your medicine more often than directed. Talk to your pediatrician regarding the use of this medicine in children. Special care may be needed. Overdosage: If you think you have taken too much of this medicine contact a poison control center or emergency room at once. NOTE: This medicine is only for you. Do not share this medicine with others. What if I  miss a dose? If you miss a dose, do not take it later in the day. Continue your normal schedule starting the next morning. Do not take double or extra doses. What may interact with this medicine? -aluminum hydroxide -antacids -aspirin -calcium supplements -drugs for inflammation like ibuprofen, naproxen, and others -iron supplements -magnesium supplements -vitamins with minerals This list may not describe all possible interactions. Give your health care provider a list of all the medicines, herbs, non-prescription drugs, or dietary supplements you use. Also tell them if you smoke, drink alcohol, or use illegal drugs. Some items may interact with your medicine. What should I watch for while using this medicine? Visit your doctor or health care professional for regular checks ups. It may be some time before you see benefit from this medicine. Do not stop taking your medicine except on your doctor's advice. Your doctor or health care professional may order blood tests and other tests to see how you are doing. You should make sure you get enough calcium and vitamin D while you are taking this medicine, unless your doctor tells you not to. Discuss the foods you eat and the vitamins you take with your health care professional. Some people who take this medicine have severe bone, joint, and/or muscle pain. This medicine may also increase your risk for a broken thigh bone. Tell your doctor right away if you have pain in your upper leg or groin. Tell your doctor if you have any pain that does not go away or that gets worse.  This medicine can make you more sensitive to the sun. If you get a rash while taking this medicine, sunlight may cause the rash to get worse. Keep out of the sun. If you cannot avoid being in the sun, wear protective clothing and use sunscreen. Do not use sun lamps or tanning beds/booths. What side effects may I notice from receiving this medicine? Side effects that you should report to  your doctor or health care professional as soon as possible: -allergic reactions like skin rash, itching or hives, swelling of the face, lips, or tongue -black or tarry stools -bone, muscle or joint pain -changes in vision -chest pain -heartburn or stomach pain -jaw pain, especially after dental work -pain or trouble when swallowing -redness, blistering, peeling or loosening of the skin, including inside the mouth Side effects that usually do not require medical attention (report to your doctor or health care professional if they continue or are bothersome): -changes in taste -diarrhea or constipation -eye pain or itching -headache -nausea or vomiting -stomach gas or fullness This list may not describe all possible side effects. Call your doctor for medical advice about side effects. You may report side effects to FDA at 1-800-FDA-1088. Where should I keep my medicine? Keep out of the reach of children. Store at room temperature of 15 and 30 degrees C (59 and 86 degrees F). Throw away any unused medicine after the expiration date. NOTE: This sheet is a summary. It may not cover all possible information. If you have questions about this medicine, talk to your doctor, pharmacist, or health care provider.  2015, Elsevier/[employer] Standard. (2011-02-04 08:56:09)

## 2015-03-20 NOTE — Progress Notes (Signed)
Veronika Heard [birth year] [id number]        74 y.o.  [id number] for breast and pelvic exam. Several issues noted below.  Past medical history,surgical history, problem list, medications, allergies, family history and social history were all reviewed and documented as reviewed in the EPIC chart.  ROS:  Performed with pertinent positives and negatives included in the history, assessment and plan.   Additional significant findings :  none   Exam: Kim Counsellor Vitals:   03/20/15 1054  BP: 120/74  Height: 5\' 4"  (1.626 m)  Weight: 131 lb (59.421 kg)   General appearance:  Normal affect, orientation and appearance. Skin: Grossly normal HEENT: Without gross lesions.  No cervical or supraclavicular adenopathy. Thyroid normal.  Lungs:  Clear without wheezing, rales or rhonchi Cardiac: RR, without RMG Abdominal:  Soft, nontender, without masses, guarding, rebound, organomegaly or hernia Breasts:  Examined lying and sitting without masses, retractions, discharge or axillary adenopathy. Pelvic:  Ext/BUS/vagina with generalized atrophic changes  Cervix plus with upper vagina difficult to visualize.  Uterus anteverted, normal size, shape and contour, midline and mobile nontender   Adnexa  Without masses or tenderness    Anus and perineum  Normal   Rectovaginal  Normal sphincter tone without palpated masses or tenderness.    Assessment/Plan:  74 y.o. [id number] female for breast and pelvic exam.   1. Postmenopausal/atrophic genital changes. No significant hot flushes, night sweats, vaginal dryness or any bleeding. Continue to monitor and report any issues or bleeding. 2. Osteoporosis. Recent DEXA shows T score -2.5. Statistically significant decline at the spine. Unchanged at the hips. Reports being on Fosamax in the past for approximately 5 years although review of her records it appears that she had been on it for 7-8 years. Stopped in 2010.  Options for treatment versus no treatment at this  point discussed. Increased fracture risk also reviewed. After lengthy discussion to include the risks benefits of various medications we've elected to restart Fosamax that she did well with this previously and then repeat her bone density in 2 years. The risks of GERD, osteonecrosis of the jaw atypical fractures all reviewed. Check baseline vitamin D level. 3. Mammography 01/2015. Continue with annual mammography. SBE monthly reviewed. 4. Colonoscopy 2014. Repeat at their recommended interval. 5. Pap smear 2012. No Pap smear done today. We both agreed to stop screening given she has no history of abnormal Pap smears and over the age of 89. 6. Health maintenance. No routine blood work done as this is done at her primary physician's office. Follow up in one year, sooner as needed.   Anastasio Auerbach MD, 11:39 AM 03/20/2015

## 2015-03-20 NOTE — Telephone Encounter (Signed)
-----   Message from Jocelyn Auerbach, MD sent at 03/20/2015 11:43 AM EDT ----- Ask patient to follow up to have a vitamin D level drawn at her convenience. We were going to do this at her office visit but I forgot to direct her to the lab. I apologize for the inconvenience but I do think it's important that we know what her vitamin D level is.

## 2015-03-23 ENCOUNTER — Other Ambulatory Visit: Payer: Medicare Other

## 2015-03-23 DIAGNOSIS — M810 Age-related osteoporosis without current pathological fracture: Secondary | ICD-10-CM

## 2015-03-24 ENCOUNTER — Telehealth: Payer: Self-pay | Admitting: *Deleted

## 2015-03-24 LAB — VITAMIN D 25 HYDROXY (VIT D DEFICIENCY, FRACTURES): Vit D, 25-Hydroxy: 42 ng/mL (ref 30–100)

## 2015-03-24 NOTE — Telephone Encounter (Signed)
definitely

## 2015-03-24 NOTE — Telephone Encounter (Signed)
Pt informed with the below. 

## 2015-03-24 NOTE — Telephone Encounter (Signed)
Pt currently taking fosamax 70 mg asked is she should continue vitamin D./ calcium supplement along with Rx? Please advise

## 2015-04-01 DIAGNOSIS — D2271 Melanocytic nevi of right lower limb, including hip: Secondary | ICD-10-CM | POA: Diagnosis not present

## 2015-04-01 DIAGNOSIS — L820 Inflamed seborrheic keratosis: Secondary | ICD-10-CM | POA: Diagnosis not present

## 2015-04-01 DIAGNOSIS — D045 Carcinoma in situ of skin of trunk: Secondary | ICD-10-CM | POA: Diagnosis not present

## 2015-04-01 DIAGNOSIS — Z85828 Personal history of other malignant neoplasm of skin: Secondary | ICD-10-CM | POA: Diagnosis not present

## 2015-04-01 DIAGNOSIS — L570 Actinic keratosis: Secondary | ICD-10-CM | POA: Diagnosis not present

## 2015-04-01 DIAGNOSIS — D225 Melanocytic nevi of trunk: Secondary | ICD-10-CM | POA: Diagnosis not present

## 2015-04-09 DIAGNOSIS — D045 Carcinoma in situ of skin of trunk: Secondary | ICD-10-CM | POA: Diagnosis not present

## 2015-04-23 ENCOUNTER — Other Ambulatory Visit: Payer: Self-pay | Admitting: Cardiovascular Disease

## 2015-05-08 ENCOUNTER — Other Ambulatory Visit (INDEPENDENT_AMBULATORY_CARE_PROVIDER_SITE_OTHER): Payer: Medicare Other

## 2015-05-08 DIAGNOSIS — I10 Essential (primary) hypertension: Secondary | ICD-10-CM

## 2015-05-08 DIAGNOSIS — Z0000 Encounter for general adult medical examination without abnormal findings: Secondary | ICD-10-CM | POA: Diagnosis not present

## 2015-05-08 DIAGNOSIS — E785 Hyperlipidemia, unspecified: Secondary | ICD-10-CM

## 2015-05-08 LAB — HEPATIC FUNCTION PANEL
ALK PHOS: 63 U/L (ref 39–117)
ALT: 12 U/L (ref 0–35)
AST: 19 U/L (ref 0–37)
Albumin: 4.2 g/dL (ref 3.5–5.2)
BILIRUBIN DIRECT: 0.2 mg/dL (ref 0.0–0.3)
BILIRUBIN TOTAL: 0.7 mg/dL (ref 0.2–1.2)
Total Protein: 7 g/dL (ref 6.0–8.3)

## 2015-05-08 LAB — BASIC METABOLIC PANEL
BUN: 14 mg/dL (ref 6–23)
CALCIUM: 9.9 mg/dL (ref 8.4–10.5)
CO2: 31 meq/L (ref 19–32)
CREATININE: 0.98 mg/dL (ref 0.40–1.20)
Chloride: 104 mEq/L (ref 96–112)
GFR: 59.13 mL/min — ABNORMAL LOW (ref >60.00)
Glucose, Bld: 84 mg/dL (ref 70–99)
Potassium: 5.1 mEq/L (ref 3.5–5.1)
Sodium: 141 mEq/L (ref 135–145)

## 2015-05-08 LAB — POCT URINALYSIS DIPSTICK
Bilirubin, UA: NEGATIVE
GLUCOSE UA: NEGATIVE
Ketones, UA: NEGATIVE
NITRITE UA: NEGATIVE
PROTEIN UA: NEGATIVE
SPEC GRAV UA: 1.025
UROBILINOGEN UA: 0.2
pH, UA: 5.5

## 2015-05-08 LAB — CBC WITH DIFFERENTIAL/PLATELET
BASOS ABS: 0 10*3/uL (ref 0.0–0.1)
BASOS PCT: 0.3 % (ref 0.0–3.0)
EOS ABS: 0.1 10*3/uL (ref 0.0–0.7)
Eosinophils Relative: 2 % (ref 0.0–5.0)
HEMATOCRIT: 44.7 % (ref 36.0–46.0)
Hemoglobin: 15 g/dL (ref 12.0–15.0)
LYMPHS ABS: 2.3 10*3/uL (ref 0.7–4.0)
LYMPHS PCT: 35.8 % (ref 12.0–46.0)
MCHC: 33.6 g/dL (ref 30.0–36.0)
MCV: 96 fl (ref 78.0–100.0)
MONO ABS: 0.4 10*3/uL (ref 0.1–1.0)
Monocytes Relative: 6.4 % (ref 3.0–12.0)
NEUTROS ABS: 3.6 10*3/uL (ref 1.4–7.7)
NEUTROS PCT: 55.5 % (ref 43.0–77.0)
PLATELETS: 211 10*3/uL (ref 150.0–400.0)
RBC: 4.65 Mil/uL (ref 3.87–5.11)
RDW: 12.5 % (ref 11.5–15.5)
WBC: 6.4 10*3/uL (ref 4.0–10.5)

## 2015-05-08 LAB — LIPID PANEL
CHOLESTEROL: 137 mg/dL (ref 0–200)
HDL: 48.9 mg/dL (ref >39.00)
LDL CALC: 66 mg/dL (ref 0–99)
NonHDL: 87.72
TRIGLYCERIDES: 110 mg/dL (ref 0.0–149.0)
Total CHOL/HDL Ratio: 3
VLDL: 22 mg/dL (ref 0.0–40.0)

## 2015-05-08 LAB — TSH: TSH: 2.93 u[IU]/mL (ref 0.35–4.50)

## 2015-05-14 ENCOUNTER — Ambulatory Visit (INDEPENDENT_AMBULATORY_CARE_PROVIDER_SITE_OTHER): Payer: Medicare Other | Admitting: Internal Medicine

## 2015-05-14 ENCOUNTER — Other Ambulatory Visit: Payer: Self-pay | Admitting: *Deleted

## 2015-05-14 ENCOUNTER — Encounter: Payer: Self-pay | Admitting: Internal Medicine

## 2015-05-14 VITALS — BP 128/80 | HR 96 | Temp 97.7°F | Resp 20 | Ht 64.0 in | Wt 132.0 lb

## 2015-05-14 DIAGNOSIS — I10 Essential (primary) hypertension: Secondary | ICD-10-CM

## 2015-05-14 DIAGNOSIS — E785 Hyperlipidemia, unspecified: Secondary | ICD-10-CM | POA: Diagnosis not present

## 2015-05-14 DIAGNOSIS — Z0000 Encounter for general adult medical examination without abnormal findings: Secondary | ICD-10-CM | POA: Diagnosis not present

## 2015-05-14 MED ORDER — TRAMADOL HCL 50 MG PO TABS: 50.0000 mg | ORAL_TABLET | Freq: Three times a day (TID) | ORAL | Status: DC | PRN

## 2015-05-14 NOTE — Patient Instructions (Signed)
Limit your sodium (Salt) intake    It is important that you exercise regularly, at least 20 minutes 3 to 4 times per week.  If you develop chest pain or shortness of breath seek  medical attention.  Take a calcium supplement, plus 800-1200 units of vitamin D  Return in one year for follow-up  Health Maintenance Adopting a healthy lifestyle and getting preventive care can go a long way to promote health and wellness. Talk with your health care Karem Tomaso about what schedule of regular examinations is right for you. This is a good chance for you to check in with your Jamarrion Budai about disease prevention and staying healthy. In between checkups, there are plenty of things you can do on your own. Experts have done a lot of research about which lifestyle changes and preventive measures are most likely to keep you healthy. Ask your health care Julina Altmann for more information. WEIGHT AND DIET  Eat a healthy diet  Be sure to include plenty of vegetables, fruits, low-fat dairy products, and lean protein.  Do not eat a lot of foods high in solid fats, added sugars, or salt.  Get regular exercise. This is one of the most important things you can do for your health.  Most adults should exercise for at least 150 minutes each week. The exercise should increase your heart rate and make you sweat (moderate-intensity exercise).  Most adults should also do strengthening exercises at least twice a week. This is in addition to the moderate-intensity exercise.  Maintain a healthy weight  Body mass index (BMI) is a measurement that can be used to identify possible weight problems. It estimates body fat based on height and weight. Your health care Naeem Quillin can help determine your BMI and help you achieve or maintain a healthy weight.  For females 20 years of age and older:   A BMI below 18.5 is considered underweight.  A BMI of 18.5 to 24.9 is normal.  A BMI of 25 to 29.9 is considered overweight.  A BMI of  30 and above is considered obese.  Watch levels of cholesterol and blood lipids  You should start having your blood tested for lipids and cholesterol at 73 years of age, then have this test every 5 years.  You may need to have your cholesterol levels checked more often if:  Your lipid or cholesterol levels are high.  You are older than 73 years of age.  You are at high risk for heart disease.  CANCER SCREENING   Lung Cancer  Lung cancer screening is recommended for adults 55-80 years old who are at high risk for lung cancer because of a history of smoking.  A yearly low-dose CT scan of the lungs is recommended for people who:  Currently smoke.  Have quit within the past 15 years.  Have at least a 30-pack-year history of smoking. A pack year is smoking an average of one pack of cigarettes a day for 1 year.  Yearly screening should continue until it has been 15 years since you quit.  Yearly screening should stop if you develop a health problem that would prevent you from having lung cancer treatment.  Breast Cancer  Practice breast self-awareness. This means understanding how your breasts normally appear and feel.  It also means doing regular breast self-exams. Let your health care Alaia Lordi know about any changes, no matter how small.  If you are in your 20s or 30s, you should have a clinical breast exam (CBE) by   a health care Samiel Peel every 1-3 years as part of a regular health exam.  If you are 40 or older, have a CBE every year. Also consider having a breast X-ray (mammogram) every year.  If you have a family history of breast cancer, talk to your health care Avry Roedl about genetic screening.  If you are at high risk for breast cancer, talk to your health care Dorin Stooksbury about having an MRI and a mammogram every year.  Breast cancer gene (BRCA) assessment is recommended for women who have family members with BRCA-related cancers. BRCA-related cancers  include:  Breast.  Ovarian.  Tubal.  Peritoneal cancers.  Results of the assessment will determine the need for genetic counseling and BRCA1 and BRCA2 testing. Cervical Cancer Routine pelvic examinations to screen for cervical cancer are no longer recommended for nonpregnant women who are considered low risk for cancer of the pelvic organs (ovaries, uterus, and vagina) and who do not have symptoms. A pelvic examination may be necessary if you have symptoms including those associated with pelvic infections. Ask your health care Thersea Manfredonia if a screening pelvic exam is right for you.   The Pap test is the screening test for cervical cancer for women who are considered at risk.  If you had a hysterectomy for a problem that was not cancer or a condition that could lead to cancer, then you no longer need Pap tests.  If you are older than 65 years, and you have had normal Pap tests for the past 10 years, you no longer need to have Pap tests.  If you have had past treatment for cervical cancer or a condition that could lead to cancer, you need Pap tests and screening for cancer for at least 20 years after your treatment.  If you no longer get a Pap test, assess your risk factors if they change (such as having a new sexual partner). This can affect whether you should start being screened again.  Some women have medical problems that increase their chance of getting cervical cancer. If this is the case for you, your health care Chi Woodham may recommend more frequent screening and Pap tests.  The human papillomavirus (HPV) test is another test that may be used for cervical cancer screening. The HPV test looks for the virus that can cause cell changes in the cervix. The cells collected during the Pap test can be tested for HPV.  The HPV test can be used to screen women 30 years of age and older. Getting tested for HPV can extend the interval between normal Pap tests from three to five years.  An HPV  test also should be used to screen women of any age who have unclear Pap test results.  After 73 years of age, women should have HPV testing as often as Pap tests.  Colorectal Cancer  This type of cancer can be detected and often prevented.  Routine colorectal cancer screening usually begins at 73 years of age and continues through 73 years of age.  Your health care Tierney Behl may recommend screening at an earlier age if you have risk factors for colon cancer.  Your health care Zykeria Laguardia may also recommend using home test kits to check for hidden blood in the stool.  A small camera at the end of a tube can be used to examine your colon directly (sigmoidoscopy or colonoscopy). This is done to check for the earliest forms of colorectal cancer.  Routine screening usually begins at age 50.  Direct examination   of the colon should be repeated every 5-10 years through 73 years of age. However, you may need to be screened more often if early forms of precancerous polyps or small growths are found. Skin Cancer  Check your skin from head to toe regularly.  Tell your health care Johnnay Pleitez about any new moles or changes in moles, especially if there is a change in a mole's shape or color.  Also tell your health care Timica Marcom if you have a mole that is larger than the size of a pencil eraser.  Always use sunscreen. Apply sunscreen liberally and repeatedly throughout the day.  Protect yourself by wearing long sleeves, pants, a wide-brimmed hat, and sunglasses whenever you are outside. HEART DISEASE, DIABETES, AND HIGH BLOOD PRESSURE   Have your blood pressure checked at least every 1-2 years. High blood pressure causes heart disease and increases the risk of stroke.  If you are between 55 years and 79 years old, ask your health care Rayhaan Huster if you should take aspirin to prevent strokes.  Have regular diabetes screenings. This involves taking a blood sample to check your fasting blood sugar  level.  If you are at a normal weight and have a low risk for diabetes, have this test once every three years after 73 years of age.  If you are overweight and have a high risk for diabetes, consider being tested at a younger age or more often. PREVENTING INFECTION  Hepatitis B  If you have a higher risk for hepatitis B, you should be screened for this virus. You are considered at high risk for hepatitis B if:  You were born in a country where hepatitis B is common. Ask your health care Jamesia Linnen which countries are considered high risk.  Your parents were born in a high-risk country, and you have not been immunized against hepatitis B (hepatitis B vaccine).  You have HIV or AIDS.  You use needles to inject street drugs.  You live with someone who has hepatitis B.  You have had sex with someone who has hepatitis B.  You get hemodialysis treatment.  You take certain medicines for conditions, including cancer, organ transplantation, and autoimmune conditions. Hepatitis C  Blood testing is recommended for:  Everyone born from 1945 through 1965.  Anyone with known risk factors for hepatitis C. Sexually transmitted infections (STIs)  You should be screened for sexually transmitted infections (STIs) including gonorrhea and chlamydia if:  You are sexually active and are younger than 73 years of age.  You are older than 73 years of age and your health care Esabella Stockinger tells you that you are at risk for this type of infection.  Your sexual activity has changed since you were last screened and you are at an increased risk for chlamydia or gonorrhea. Ask your health care Maylea Soria if you are at risk.  If you do not have HIV, but are at risk, it may be recommended that you take a prescription medicine daily to prevent HIV infection. This is called pre-exposure prophylaxis (PrEP). You are considered at risk if:  You are sexually active and do not regularly use condoms or know the HIV status  of your partner(s).  You take drugs by injection.  You are sexually active with a partner who has HIV. Talk with your health care Grantland Want about whether you are at high risk of being infected with HIV. If you choose to begin PrEP, you should first be tested for HIV. You should then be tested every   3 months for as long as you are taking PrEP.  PREGNANCY   If you are premenopausal and you may become pregnant, ask your health care Candice Lunney about preconception counseling.  If you may become pregnant, take 400 to 800 micrograms (mcg) of folic acid every day.  If you want to prevent pregnancy, talk to your health care Aribella Vavra about birth control (contraception). OSTEOPOROSIS AND MENOPAUSE   Osteoporosis is a disease in which the bones lose minerals and strength with aging. This can result in serious bone fractures. Your risk for osteoporosis can be identified using a bone density scan.  If you are 65 years of age or older, or if you are at risk for osteoporosis and fractures, ask your health care Rebeccah Ivins if you should be screened.  Ask your health care Dawnetta Copenhaver whether you should take a calcium or vitamin D supplement to lower your risk for osteoporosis.  Menopause may have certain physical symptoms and risks.  Hormone replacement therapy may reduce some of these symptoms and risks. Talk to your health care Laporcha Marchesi about whether hormone replacement therapy is right for you.  HOME CARE INSTRUCTIONS   Schedule regular health, dental, and eye exams.  Stay current with your immunizations.   Do not use any tobacco products including cigarettes, chewing tobacco, or electronic cigarettes.  If you are pregnant, do not drink alcohol.  If you are breastfeeding, limit how much and how often you drink alcohol.  Limit alcohol intake to no more than 1 drink per day for nonpregnant women. One drink equals 12 ounces of beer, 5 ounces of wine, or 1 ounces of hard liquor.  Do not use street  drugs.  Do not share needles.  Ask your health care Alif Petrak for help if you need support or information about quitting drugs.  Tell your health care Colbi Schiltz if you often feel depressed.  Tell your health care Vicky Schleich if you have ever been abused or do not feel safe at home. Document Released: 02/21/2011 Document Revised: 12/23/2013 Document Reviewed: 07/10/2013 Exit[employer] Patient Information 2015 [employer]. This information is not intended to replace advice given to you by your health care Mariabelen Pressly. Make sure you discuss any questions you have with your health care Lofton Leon.  

## 2015-05-14 NOTE — Progress Notes (Signed)
Patient ID: Jocelyn Day, female   DOB: 14-Nov-1940, 74 y.o.   MRN: [id number]  Subjective:    Patient ID: Jocelyn Day, female    DOB: [birth year], 74 y.o.   MRN: [id number]  HPI 74 -year-old patient who is seen today for a preventive health examination.   She is followed by cardiology for Takotsubo CM;  she has done well and her last ejection fraction was apparently 50%. She is also followed by OB/GYN and has had a recent gynecologic evaluation She has a history of anxiety depression since the  death of her husband in Jul 27, 2011 from esophageal cancer. She has hypertension. She has a long history of dyslipidemia that has been treated with diet only.  Risk and benefits of statin therapy discussed last year and now is on low intensity statin due to elevated CHD risk  Medicare wellness exam:  1. Risk factors, based on past  M,S,F history- cardiovascular risk factors include hypertension and mild dyslipidemia. She has a history of acute coronary syndrome secondary to Takotsubo   Cardiomyopathy and has had a heart catheterization that was free of significant coronary artery disease  2.  Physical activities: No activity restrictions.  Quite active at her health club  3.  Depression/mood: History of anxiety depression and grief reaction from the death of her husband  4.  Hearing: No deficits  5.  ADL's: Independent in all aspects of daily living  6.  Fall risk: Low  7.  Home safety: No problems identified  8.  Height weight, and visual acuity; heart weight stable no change in visual acuity  9.  Counseling: Heart healthy diet regular exercise all encouraged  10. Lab orders based on risk factors: Laboratory profile reviewed 11. Referral : Followup cardiology  12. Care plan: Continue present regimen. 13. Cognitive assessment: Alert and oriented normal affect. No cognitive dysfunction.  14.  Preventive services will include annual mammogram.  She has an annual gynecologic  visit.colonoscopy performed 2014.  She will have annual clinical examinations with primary care, including screening lab.  She will be maintained on 10 year colonoscopies and continued to have annual eye examinations  15.  Provider list includes cardiology OB/GYN and GI primary care ophthalmology    Past Medical History  Diagnosis Date  . ACS (acute coronary syndrome) 10/20/2010    Normal coronaries per cath 2/12  . Calcium oxalate renal stones   . Osteoporosis 01/2015    T score -2.5  . Atrophic vaginitis   . Insomnia   . Takotsubo cardiomyopathy 10/21/2010    Acute coronary syndrome/Takotsubo cardiomyopathy  . Cancer     skin cancer-basal cell    Social History   Social History  . Marital Status: Married    Spouse Name: N/A  . Number of Children: N/A  . Years of Education: N/A   Occupational History  . Not on file.   Social History Main Topics  . Smoking status: Never Smoker   . Smokeless tobacco: Never Used  . Alcohol Use: 0.0 oz/week    0 Standard drinks or equivalent per week     Comment: once or twice  a month a glasses of wine  . Drug Use: No  . Sexual Activity: No     Comment: 1st intercourse 59 yo-1 partner   Other Topics Concern  . Not on file   Social History Narrative    Past Surgical History  Procedure Laterality Date  . Cholecystectomy  1993  . Cesarean section  1974  . Cesarean section  1977  . Dilation and curettage of uterus  1990  . Kidney stone surgery  2010  . 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 Basal cell carinoma excised  2005  . Colonoscopy    . Polypectomy      Family History  Problem Relation Age of Onset  . Hypertension Mother   . Cancer Father     prostate  . Heart disease Neg Hx   . Colon cancer Neg Hx   . Diabetes Brother     liver translplant and medication induced the diabetes  .  Diabetes Paternal Uncle   . Diabetes Cousin     No Known Allergies  Current Outpatient Prescriptions on File Prior to Visit  Medication Sig Dispense Refill  . alendronate (FOSAMAX) 70 MG tablet Take 1 tablet (70 mg total) by mouth every 7 (seven) days. Take with a full glass of water on an empty stomach. 4 tablet 11  . aspirin 81 MG tablet Take 81 mg by mouth daily.      [name] atorvastatin (LIPITOR) 20 MG tablet TAKE 1 TABLET BY MOUTH DAILY 90 tablet 1  . Calcium Carb-Cholecalciferol (CALCIUM PLUS VITAMIN D3 PO) Take by mouth 2 (two) times daily.      . carvedilol (COREG) 12.5 MG tablet 12.5 mg. Take one tablet in the morning and take one tablet at bedtime daily by mouth.    . diphenhydrAMINE (BENADRYL ALLERGY) 25 mg capsule Take 25 mg by mouth every 6 (six) hours as needed for itching.    [name] lisinopril (PRINIVIL,ZESTRIL) 5 MG tablet TAKE 1 TABLET BY MOUTH EVERY DAY 90 tablet 3   No current facility-administered medications on file prior to visit.    BP 128/80 mmHg  Pulse 96  Temp(Src) 97.7 F (36.5 C) (Oral)  Resp 20  Ht 5\' 4"  (1.626 m)  Wt 132 lb (59.875 kg)  BMI 22.65 kg/m2  SpO2 98%      Review of Systems  Constitutional: Negative for fever, appetite change, fatigue and unexpected weight change.  HENT: Negative for congestion, dental problem, ear pain, hearing loss, mouth sores, nosebleeds, sinus pressure, sore throat, tinnitus, trouble swallowing and voice change.   Eyes: Negative for photophobia, pain, redness and visual disturbance.  Respiratory: Negative for cough, chest tightness and shortness of breath.   Cardiovascular: Negative for chest pain, palpitations and leg swelling.  Gastrointestinal: Negative for nausea, vomiting, abdominal pain, diarrhea, constipation, blood in stool, abdominal distention and rectal pain.  Genitourinary: Negative for dysuria, urgency, frequency, hematuria, flank pain, vaginal bleeding, vaginal discharge, difficulty urinating, genital sores,  vaginal pain, menstrual problem and pelvic pain.  Musculoskeletal: Negative for back pain, arthralgias and neck stiffness.  Skin: Negative for rash.  Neurological: Negative for dizziness, syncope, speech difficulty, weakness, light-headedness, numbness and headaches.  Hematological: Negative for adenopathy. Does not bruise/bleed easily.  Psychiatric/Behavioral: Negative for suicidal ideas, behavioral problems, self-injury, dysphoric mood and agitation. The patient is nervous/anxious.        Objective:   Physical Exam  Constitutional: She is oriented to person, place, and time. She appears well-developed and well-nourished.  HENT:  Head: Normocephalic and atraumatic.  Right Ear: External ear normal.  Left Ear: External ear normal.  Mouth/Throat: Oropharynx is clear and moist.  Eyes: Conjunctivae and EOM are normal.  Neck: Normal range of motion. Neck supple. No JVD present. No  thyromegaly present.  Cardiovascular: Normal rate, regular rhythm, normal heart sounds and intact distal pulses.   No murmur heard. Pulmonary/Chest: Effort normal and breath sounds normal. She has no wheezes. She has no rales.  Abdominal: Soft. Bowel sounds are normal. She exhibits no distension and no mass. There is no tenderness. There is no rebound and no guarding.  Musculoskeletal: Normal range of motion. She exhibits no edema or tenderness.  Neurological: She is alert and oriented to person, place, and time. She has normal reflexes. No cranial nerve deficit. She exhibits normal muscle tone. Coordination normal.  Skin: Skin is warm and dry. No rash noted.  Psychiatric: She has a normal mood and affect. Her behavior is normal.          Assessment & Plan:   Preventive health exam Mild hypertension stable.  Stable on present regimen Dyslipidemia.   Will continue atorvastatin 20 mg daily Return here in one year

## 2015-05-14 NOTE — Progress Notes (Signed)
Pre visit review using our clinic review tool, if applicable. No additional management support is needed unless otherwise documented below in the visit note. 

## 2015-06-01 ENCOUNTER — Ambulatory Visit (INDEPENDENT_AMBULATORY_CARE_PROVIDER_SITE_OTHER): Payer: Medicare Other | Admitting: Internal Medicine

## 2015-06-01 ENCOUNTER — Encounter: Payer: Self-pay | Admitting: Internal Medicine

## 2015-06-01 VITALS — BP 120/70 | HR 61 | Temp 98.6°F | Resp 18 | Ht 64.0 in | Wt 132.0 lb

## 2015-06-01 DIAGNOSIS — L723 Sebaceous cyst: Secondary | ICD-10-CM

## 2015-06-01 NOTE — Patient Instructions (Signed)
Keep the area clean and dry and covered with a Band-Aid  Call or return to clinic prn if these symptoms worsen or fail to improve as anticipated.

## 2015-06-01 NOTE — Progress Notes (Signed)
Pre visit review using our clinic review tool, if applicable. No additional management support is needed unless otherwise documented below in the visit note. 

## 2015-06-01 NOTE — Progress Notes (Signed)
Subjective:    Patient ID: Jocelyn Day, female    DOB: October 06, 1940, 74 y.o.   MRN: [id number]  HPI  74 year old patient who is seen today concerned about a small nodule involving the distal right index finger just proximal to the nailbed.  This has been the red and at times draining slightly. Past Medical History  Diagnosis Date  . ACS (acute coronary syndrome) ([place]) 10/20/2010    Normal coronaries per cath 2/12  . Calcium oxalate renal stones   . Osteoporosis 01/2015    T score -2.5  . Atrophic vaginitis   . Insomnia   . Takotsubo cardiomyopathy 10/21/2010    Acute coronary syndrome/Takotsubo cardiomyopathy  . Cancer ([place])     skin cancer-basal cell    Social History   Social History  . Marital Status: Married    Spouse Name: N/A  . Number of Children: N/A  . Years of Education: N/A   Occupational History  . Not on file.   Social History Main Topics  . Smoking status: Never Smoker   . Smokeless tobacco: Never Used  . Alcohol Use: 0.0 oz/week    0 Standard drinks or equivalent per week     Comment: once or twice  a month a glasses of wine  . Drug Use: No  . Sexual Activity: No     Comment: 1st intercourse 24 yo-1 partner   Other Topics Concern  . Not on file   Social History Narrative    Past Surgical History  Procedure Laterality Date  . Cholecystectomy  1993  . Cesarean section  1974  . Cesarean section  1977  . Dilation and curettage of uterus  1990  . Kidney stone surgery  2010  . 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 Basal cell carinoma excised  2005  . Colonoscopy    . Polypectomy      Family History  Problem Relation Age of Onset  . Hypertension Mother   . Cancer Father     prostate  . Heart disease Neg Hx   . Colon cancer Neg Hx   . Diabetes Brother     liver translplant and medication induced the diabetes    . Diabetes Paternal Uncle   . Diabetes Cousin     No Known Allergies  Current Outpatient Prescriptions on File Prior to Visit  Medication Sig Dispense Refill  . alendronate (FOSAMAX) 70 MG tablet Take 1 tablet (70 mg total) by mouth every 7 (seven) days. Take with a full glass of water on an empty stomach. 4 tablet 11  . aspirin 81 MG tablet Take 81 mg by mouth daily.      [name] atorvastatin (LIPITOR) 20 MG tablet TAKE 1 TABLET BY MOUTH DAILY 90 tablet 1  . Calcium Carb-Cholecalciferol (CALCIUM PLUS VITAMIN D3 PO) Take by mouth 2 (two) times daily.      . carvedilol (COREG) 12.5 MG tablet 12.5 mg. Take one tablet in the morning and take one tablet at bedtime daily by mouth.    . diphenhydrAMINE (BENADRYL ALLERGY) 25 mg capsule Take 25 mg by mouth every 6 (six) hours as needed for itching.    . fluorouracil (EFUDEX) 5 % cream APPLY ON THE SKIN AA BID FOR 2 WEEKS  0  . lisinopril (PRINIVIL,ZESTRIL) 5 MG tablet TAKE 1 TABLET BY MOUTH EVERY DAY 90 tablet 3  . traMADol (  ULTRAM) 50 MG tablet Take 1 tablet (50 mg total) by mouth every 8 (eight) hours as needed. 30 tablet 2   No current facility-administered medications on file prior to visit.    BP 120/70 mmHg  Pulse 61  Temp(Src) 98.6 F (37 C) (Oral)  Resp 18  Ht 5\' 4"  (1.626 m)  Wt 132 lb (59.875 kg)  BMI 22.65 kg/m2  SpO2 95%     Review of Systems  Skin: Positive for wound.       Objective:   Physical Exam  Constitutional: She appears well-developed and well-nourished. No distress.  Skin:  5 mm nodule, right distal index finger just proximal to the nailbed          Assessment & Plan:   Benign cyst right distal index finger just proximal to the nailbed.  Procedure note after informed consent, the area was cleansed with the alcohol and Betadine.  Anesthesia obtained with a topical anesthetic spray.  A small incision was made with a scalpel and the contents evacuated.  Antibiotic ointment applied and the wound dressed.   Patient tolerated the procedure well.  Local wound care discussed

## 2015-06-02 DIAGNOSIS — H5213 Myopia, bilateral: Secondary | ICD-10-CM | POA: Diagnosis not present

## 2015-06-02 DIAGNOSIS — H2513 Age-related nuclear cataract, bilateral: Secondary | ICD-10-CM | POA: Diagnosis not present

## 2015-06-08 ENCOUNTER — Other Ambulatory Visit: Payer: Self-pay

## 2015-06-08 MED ORDER — CARVEDILOL 12.5 MG PO TABS: 12.5000 mg | ORAL_TABLET | Freq: Two times a day (BID) | ORAL | Status: DC

## 2015-08-12 DIAGNOSIS — Z85828 Personal history of other malignant neoplasm of skin: Secondary | ICD-10-CM | POA: Diagnosis not present

## 2015-08-12 DIAGNOSIS — M7130 Other bursal cyst, unspecified site: Secondary | ICD-10-CM | POA: Diagnosis not present

## 2015-09-26 ENCOUNTER — Other Ambulatory Visit: Payer: Self-pay | Admitting: Internal Medicine

## 2015-10-12 ENCOUNTER — Other Ambulatory Visit: Payer: Self-pay | Admitting: Cardiovascular Disease

## 2015-10-12 NOTE — Telephone Encounter (Signed)
Rx request sent to pharmacy.  

## 2015-11-18 ENCOUNTER — Encounter: Payer: Self-pay | Admitting: Cardiovascular Disease

## 2015-12-25 ENCOUNTER — Other Ambulatory Visit: Payer: Self-pay | Admitting: Internal Medicine

## 2015-12-26 ENCOUNTER — Other Ambulatory Visit: Payer: Self-pay | Admitting: Cardiovascular Disease

## 2016-01-25 ENCOUNTER — Encounter: Payer: Self-pay | Admitting: Gynecology

## 2016-01-25 DIAGNOSIS — Z1231 Encounter for screening mammogram for malignant neoplasm of breast: Secondary | ICD-10-CM | POA: Diagnosis not present

## 2016-01-25 DIAGNOSIS — R922 Inconclusive mammogram: Secondary | ICD-10-CM | POA: Diagnosis not present

## 2016-01-25 DIAGNOSIS — R928 Other abnormal and inconclusive findings on diagnostic imaging of breast: Secondary | ICD-10-CM | POA: Diagnosis not present

## 2016-01-26 DIAGNOSIS — R928 Other abnormal and inconclusive findings on diagnostic imaging of breast: Secondary | ICD-10-CM | POA: Diagnosis not present

## 2016-01-26 DIAGNOSIS — R922 Inconclusive mammogram: Secondary | ICD-10-CM | POA: Diagnosis not present

## 2016-01-27 ENCOUNTER — Other Ambulatory Visit: Payer: Self-pay | Admitting: Radiology

## 2016-01-27 ENCOUNTER — Encounter: Payer: Self-pay | Admitting: Gynecology

## 2016-01-27 DIAGNOSIS — R928 Other abnormal and inconclusive findings on diagnostic imaging of breast: Secondary | ICD-10-CM | POA: Diagnosis not present

## 2016-01-27 DIAGNOSIS — R922 Inconclusive mammogram: Secondary | ICD-10-CM | POA: Diagnosis not present

## 2016-01-27 DIAGNOSIS — Z1231 Encounter for screening mammogram for malignant neoplasm of breast: Secondary | ICD-10-CM | POA: Diagnosis not present

## 2016-01-27 DIAGNOSIS — D0511 Intraductal carcinoma in situ of right breast: Secondary | ICD-10-CM | POA: Diagnosis not present

## 2016-01-27 DIAGNOSIS — R920 Mammographic microcalcification found on diagnostic imaging of breast: Secondary | ICD-10-CM | POA: Diagnosis not present

## 2016-01-27 HISTORY — PX: BREAST BIOPSY: SHX20

## 2016-01-28 ENCOUNTER — Telehealth: Payer: Self-pay | Admitting: *Deleted

## 2016-01-28 ENCOUNTER — Encounter: Payer: Self-pay | Admitting: *Deleted

## 2016-01-28 DIAGNOSIS — C50411 Malignant neoplasm of upper-outer quadrant of right female breast: Secondary | ICD-10-CM

## 2016-01-28 HISTORY — DX: Malignant neoplasm of upper-outer quadrant of right female breast: C50.411

## 2016-01-28 NOTE — Telephone Encounter (Signed)
Confirmed [facility] for 02/10/16 at 1215 .  Instructions and contact information given.

## 2016-01-29 ENCOUNTER — Encounter: Payer: Self-pay | Admitting: Family Medicine

## 2016-01-29 ENCOUNTER — Telehealth: Payer: Self-pay | Admitting: *Deleted

## 2016-01-29 NOTE — Telephone Encounter (Signed)
Mailed clinic packet to pt.  

## 2016-02-03 ENCOUNTER — Encounter: Payer: Self-pay | Admitting: Gynecology

## 2016-02-10 ENCOUNTER — Other Ambulatory Visit: Payer: Self-pay | Admitting: General Surgery

## 2016-02-10 ENCOUNTER — Encounter: Payer: Self-pay | Admitting: Hematology and Oncology

## 2016-02-10 ENCOUNTER — Ambulatory Visit
Admission: RE | Admit: 2016-02-10 | Discharge: 2016-02-10 | Disposition: A | Discharge status: Home / Self Care | Payer: Medicare Other | Source: Ambulatory Visit | Attending: Radiation Oncology | Admitting: Radiation Oncology

## 2016-02-10 ENCOUNTER — Encounter: Payer: Self-pay | Admitting: General Practice

## 2016-02-10 ENCOUNTER — Encounter: Payer: Self-pay | Admitting: *Deleted

## 2016-02-10 ENCOUNTER — Other Ambulatory Visit (HOSPITAL_BASED_OUTPATIENT_CLINIC_OR_DEPARTMENT_OTHER): Payer: Medicare Other

## 2016-02-10 ENCOUNTER — Ambulatory Visit: Payer: Medicare Other | Admitting: Physical Therapy

## 2016-02-10 ENCOUNTER — Ambulatory Visit (HOSPITAL_BASED_OUTPATIENT_CLINIC_OR_DEPARTMENT_OTHER): Payer: Medicare Other | Admitting: Hematology and Oncology

## 2016-02-10 VITALS — BP 119/66 | HR 61 | Temp 98.4°F | Resp 17 | Ht 64.0 in | Wt 128.8 lb

## 2016-02-10 DIAGNOSIS — C50411 Malignant neoplasm of upper-outer quadrant of right female breast: Secondary | ICD-10-CM | POA: Diagnosis not present

## 2016-02-10 DIAGNOSIS — Z9049 Acquired absence of other specified parts of digestive tract: Secondary | ICD-10-CM | POA: Diagnosis not present

## 2016-02-10 DIAGNOSIS — Z170 Estrogen receptor positive status [ER+]: Secondary | ICD-10-CM | POA: Diagnosis not present

## 2016-02-10 DIAGNOSIS — I5181 Takotsubo syndrome: Secondary | ICD-10-CM | POA: Diagnosis not present

## 2016-02-10 DIAGNOSIS — D0511 Intraductal carcinoma in situ of right breast: Secondary | ICD-10-CM

## 2016-02-10 DIAGNOSIS — N200 Calculus of kidney: Secondary | ICD-10-CM | POA: Diagnosis not present

## 2016-02-10 DIAGNOSIS — I10 Essential (primary) hypertension: Secondary | ICD-10-CM | POA: Diagnosis not present

## 2016-02-10 LAB — COMPREHENSIVE METABOLIC PANEL
ALT: 15 U/L (ref 0–55)
ANION GAP: 7 meq/L (ref 3–11)
AST: 23 U/L (ref 5–34)
Albumin: 4.1 g/dL (ref 3.5–5.0)
Alkaline Phosphatase: 61 U/L (ref 40–150)
BUN: 10.8 mg/dL (ref 7.0–26.0)
CHLORIDE: 105 meq/L (ref 98–109)
CO2: 26 meq/L (ref 22–29)
Calcium: 9.3 mg/dL (ref 8.4–10.4)
Creatinine: 1 mg/dL (ref 0.6–1.1)
EGFR: 53 mL/min/{1.73_m2} — AB (ref >90)
Glucose: 143 mg/dl — ABNORMAL HIGH (ref 70–140)
Potassium: 4.9 mEq/L (ref 3.5–5.1)
Sodium: 139 mEq/L (ref 136–145)
Total Bilirubin: 0.62 mg/dL (ref 0.20–1.20)
Total Protein: 7.2 g/dL (ref 6.4–8.3)

## 2016-02-10 LAB — CBC WITH DIFFERENTIAL/PLATELET
BASO%: 0.6 % (ref 0.0–2.0)
Basophils Absolute: 0 10*3/uL (ref 0.0–0.1)
EOS ABS: 0 10*3/uL (ref 0.0–0.5)
EOS%: 0.4 % (ref 0.0–7.0)
HCT: 45.5 % (ref 34.8–46.6)
HGB: 14.9 g/dL (ref 11.6–15.9)
LYMPH%: 22.3 % (ref 14.0–49.7)
MCH: 31.9 pg (ref 25.1–34.0)
MCHC: 32.8 g/dL (ref 31.5–36.0)
MCV: 97.4 fL (ref 79.5–101.0)
MONO#: 0.4 10*3/uL (ref 0.1–0.9)
MONO%: 5.8 % (ref 0.0–14.0)
NEUT#: 4.5 10*3/uL (ref 1.5–6.5)
NEUT%: 70.9 % (ref 38.4–76.8)
PLATELETS: 195 10*3/uL (ref 145–400)
RBC: 4.67 10*6/uL (ref 3.70–5.45)
RDW: 12.3 % (ref 11.2–14.5)
WBC: 6.4 10*3/uL (ref 3.9–10.3)
lymph#: 1.4 10*3/uL (ref 0.9–3.3)

## 2016-02-10 NOTE — Assessment & Plan Note (Signed)
Right breast biopsy 01/27/2016: DCIS with calcifications lower intermediate grade, ER 100%, PR 80%, Tis N0 stage 0; screening mammogram revealed right breast calcifications 8 mm  Pathology review: I discussed with the patient the difference between DCIS and invasive breast cancer. It is considered a precancerous lesion. DCIS is classified as a 0. It is generally detected through mammograms as calcifications. We discussed the significance of grades and its impact on prognosis. We also discussed the importance of ER and PR receptors and their implications to adjuvant treatment options. Prognosis of DCIS dependence on grade, comedo necrosis. It is anticipated that if not treated, 20-30% of DCIS can develop into invasive breast cancer.  Recommendation: 1. Breast conserving surgery vs participation in clinical trial COMET  2. Followed by adjuvant radiation therapy 3. Followed by antiestrogen therapy with tamoxifen 5 years  AFT 25 COMET Phase 3 clinical trial for low risk DCIS grade 1/2 PR positive, age greater than 40 randomized to surgery +/- radiation, +/- endocrine therapy versus active surveillance with +/- endocrine therapy surveillance with mammograms every 6 months for 5 years;patient's have option to decline elevated arm and still be followed on study   Tamoxifen counseling: We discussed the risks and benefits of tamoxifen. These include but not limited to insomnia, hot flashes, mood changes, vaginal dryness, and weight gain. Although rare, serious side effects including endometrial cancer, risk of blood clots were also discussed. We strongly believe that the benefits far outweigh the risks. Patient understands these risks and consented to starting treatment. Planned treatment duration is 5 years.

## 2016-02-10 NOTE — Progress Notes (Signed)
Radiation Oncology         [phone] ________________________________  Initial Outpatient Consultation  Name: Jocelyn Day MRN: [id number]  Date: 02/10/2016  DOB: 1940/10/28  KG:[id number],YOVZC Pilar Plate, MD  Marletta Lor, MD   REFERRING PHYSICIAN: Marletta Lor, MD  DIAGNOSIS: Clinical Stage 0,  DCIS of the right breast  HISTORY OF PRESENT ILLNESS::Jocelyn Day is a 75 y.o. female who had a screening mammogram on 01/25/16 at [facility]. This noted a group of heterogeneous calcifications in the UOQ of the right breast (2.2 cm). The following day, she had a diagnostic right mammogram noting pleomorphic calcifications in the UOQ of the right breast spanning 0.8 cm. Biopsy of this area on 01/27/16 revealed low to intermediate grade DCIS with calcifications (ER 100% positive, PR 80% positive). The patient presents today in multidisciplinary breast clinic to discuss the role of radiation in the management of her disease.  PREVIOUS RADIATION THERAPY: No  PAST MEDICAL HISTORY:  has a past medical history of ACS (acute coronary syndrome) ([place]) (10/20/2010); Calcium oxalate renal stones; Osteoporosis (01/2015); Atrophic vaginitis; Insomnia; Takotsubo cardiomyopathy (10/21/2010); Cancer [facility]); and Breast cancer of upper-outer quadrant of right female breast ([place]) (01/28/2016).    PAST SURGICAL HISTORY: Past Surgical History  Procedure Laterality Date  . Cholecystectomy  1993  . Cesarean section  1974  . Cesarean section  1977  . Dilation and curettage of uterus  1990  . Kidney stone surgery  2010  . 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 Basal cell carinoma excised  2005  . Colonoscopy    . Polypectomy    . Breast biopsy  01/27/2016    Malignant    FAMILY HISTORY: family history includes Cancer in her father; Cervical cancer in her sister;  Diabetes in her brother, cousin, and paternal uncle; Hypertension in her mother. There is no history of Heart disease or Colon cancer.  SOCIAL HISTORY:  reports that she has never smoked. She has never used smokeless tobacco. She reports that she drinks alcohol. She reports that she does not use illicit drugs.  ALLERGIES: Review of patient's allergies indicates no known allergies.  MEDICATIONS:  Current Outpatient Prescriptions  Medication Sig Dispense Refill  . alendronate (FOSAMAX) 70 MG tablet Take 1 tablet (70 mg total) by mouth every 7 (seven) days. Take with a full glass of water on an empty stomach. 4 tablet 11  . aspirin 81 MG tablet Take 81 mg by mouth daily.      [name] atorvastatin (LIPITOR) 20 MG tablet TAKE 1 TABLET BY MOUTH DAILY 90 tablet 2  . Calcium Carb-Cholecalciferol (CALCIUM PLUS VITAMIN D3 PO) Take by mouth 2 (two) times daily.      . carvedilol (COREG) 12.5 MG tablet TAKE 1 TABLET(12.5 MG) BY MOUTH TWICE DAILY WITH A MEAL 60 tablet 1  . diphenhydrAMINE (BENADRYL ALLERGY) 25 mg capsule Take 25 mg by mouth every 6 (six) hours as needed for itching.    [name] lisinopril (PRINIVIL,ZESTRIL) 5 MG tablet TAKE 1 TABLET BY MOUTH EVERY DAY 90 tablet 3   No current facility-administered medications for this encounter.    REVIEW OF SYSTEMS:  A 15 point review of systems is documented in the electronic medical record. This was obtained by the nursing staff. However, I reviewed this with the patient to discuss relevant findings and make appropriate changes.  Pertinent items noted in HPI and  remainder of comprehensive ROS otherwise negative.  No pain within the breast area nipple discharge or bleeding prior to diagnosis  The patient wears contacts and glasses.  Gynecologic History  Age at first menstrual period? 14  Are you still having periods? No Approximate date of last period? 1999  If you no longer have periods: Have you used hormone replacement? No Obstetric History:  How many  children have you carried to term? 2 Your age at first live birth? 44  Pregnant now or trying to get pregnant? No  Have you used birth control pills or hormone shots for contraception? Yes  If so, for how long (or approximate dates)? A few months in 1971  Would you be interested in learning more about the options to preserve fertility? No Health Maintenance:  Have you ever had a colonoscopy? Yes If yes, date? 01/01/2013, 10/23/2007, 06/11/2002  Have you ever had a bone density? Yes If yes, date? 01/22/15  Date of your last PAP smear? 2010/2011? Date of your FIRST mammogram? Age 68  PHYSICAL EXAM:  vitals were not taken for this visit.  Vitals with BMI 02/10/2016  Height 5' 4"   Weight 128 lbs 13 oz  BMI 69.7  Systolic 948  Diastolic 66  Pulse 61  Respirations 17  Lungs are clear to auscultation bilaterally. Heart has regular rate and rhythm. No palpable cervical, supraclavicular, or axillary adenopathy. Right breast shows bruising in the UOQ of the right breast. And some thickening superior to the brushing. No dominant mass, nipple discharge, or bleeding. Left breast exam shows no dominant mass.  ECOG = 0  LABORATORY DATA:  Lab Results  Component Value Date   WBC 6.4 02/10/2016   HGB 14.9 02/10/2016   HCT 45.5 02/10/2016   MCV 97.4 02/10/2016   PLT 195 02/10/2016   NEUTROABS 4.5 02/10/2016   Lab Results  Component Value Date   NA 139 02/10/2016   K 4.9 02/10/2016   CL 104 05/08/2015   CO2 26 02/10/2016   GLUCOSE 143* 02/10/2016   CREATININE 1.0 02/10/2016   CALCIUM 9.3 02/10/2016      RADIOGRAPHY: Reports and imaging from [facility] reviewed at the multidisciplinary breast clinic earlier today    IMPRESSION: Clinical Stage 0 DCIS of the right breast. I spoke to the patient today regarding her diagnosis and options for treatment. We discussed the equivalence in terms of survival and local failure between mastectomy and breast conservation. We discussed the role of  radiation in decreasing local failures in patients who undergo lumpectomy. We discussed the process of CT simulation and the placement tattoos. We discussed 4-6 weeks of treatment as an outpatient. We discussed the possibility of asymptomatic lung damage. We discussed the low likelihood of secondary malignancies. We discussed the possible side effects including but not limited to skin redness, fatigue, permanent skin darkening, and breast swelling. She met with medical oncology as well as a member of our patient family support team and our physical therapist. We also discussed the Comet Trial and she is considering enrollment in that study.   PLAN: The patient decided she wants to proceed with a right lumpectomy, but would still like to be enrolled in the Comet Trial. She will in Group 2 (randomized, but declined allocated arm). Patient will be seen after her lumpectomy for further evaluation and consideration for radiation therapy as part of her overall management.   AFT 25 COMET Phase 3 clinical trial for low risk DCIS grade 1/2 PR positive, age  greater than 40 randomized to surgery +/- radiation, +/- endocrine therapy versus active surveillance with +/- endocrine therapy surveillance with mammograms every 6 months for 5 years;patient's have option to decline randomized arm and still be followed on study.  Blair Promise, PhD, MD  This document serves as a record of services personally performed by Gery Pray, MD. It was created on his behalf by Darcus Austin, a trained medical scribe. The creation of this record is based on the scribe's personal observations and the provider's statements to them. This document has been checked and approved by the attending provider.

## 2016-02-10 NOTE — Addendum Note (Signed)
Addended by: Cheree Ditto on: 02/10/2016 03:12 PM   Modules accepted: Orders, Medications

## 2016-02-10 NOTE — Progress Notes (Signed)
[facility] NOTE  Patient Care Team: Marletta Lor, MD as PCP - General (Internal Medicine) [name], MD as Consulting Physician (General Surgery) Nicholas Lose, MD as Consulting Physician (Hematology and Oncology) Gery Pray, MD as Consulting Physician (Radiation Oncology)  CHIEF COMPLAINTS/PURPOSE OF CONSULTATION:  Newly diagnosed right breast DCIS  HISTORY OF PRESENTING ILLNESS:  Jocelyn Day 75 y.o. female is here because of recent diagnosis of right breast DCIS. Patient had a routine screening mammogram the detected calcifications in the right breast which led to ultrasound guided biopsy that revealed low to intermediate grade DCIS that is ER/PR positive and she was presented this morning at the multidisciplinary tumor board and she is here today to discuss a treatment plan. She denies any pain discomfort or any other concerns in the breast. She feels that the clip that was placed after the biopsy had migrated to the skin.   I reviewed her records extensively and collaborated the history with the patient.  SUMMARY OF ONCOLOGIC HISTORY:   Breast cancer of upper-outer quadrant of right female breast ([place])   01/27/2016 Initial Diagnosis Right breast biopsy: DCIS with calcifications lower intermediate grade, ER 100%, PR 80%, Tis N0 stage 0; screening mammogram revealed right breast calcifications 8 mm   MEDICAL HISTORY:  Past Medical History  Diagnosis Date  . ACS (acute coronary syndrome) ([place]) 10/20/2010    Normal coronaries per cath 2/12  . Calcium oxalate renal stones   . Osteoporosis 01/2015    T score -2.5  . Atrophic vaginitis   . Insomnia   . Takotsubo cardiomyopathy 10/21/2010    Acute coronary syndrome/Takotsubo cardiomyopathy  . Cancer ([place])     skin cancer-basal cell  . Breast cancer of upper-outer quadrant of right female breast ([place]) 01/28/2016    SURGICAL HISTORY: Past Surgical History  Procedure Laterality Date  . Cholecystectomy   1993  . Cesarean section  1974  . Cesarean section  1977  . Dilation and curettage of uterus  1990  . Kidney stone surgery  2010  . 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 Basal cell carinoma excised  2005  . Colonoscopy    . Polypectomy    . Breast biopsy  01/27/2016    Malignant    SOCIAL HISTORY: Social History   Social History  . Marital Status: Married    Spouse Name: N/A  . Number of Children: N/A  . Years of Education: N/A   Occupational History  . Not on file.   Social History Main Topics  . Smoking status: Never Smoker   . Smokeless tobacco: Never Used  . Alcohol Use: 0.0 oz/week    0 Standard drinks or equivalent per week     Comment: once or twice  a month a glasses of wine  . Drug Use: No  . Sexual Activity: No     Comment: 1st intercourse 19 yo-1 partner   Other Topics Concern  . Not on file   Social History Narrative    FAMILY HISTORY: Family History  Problem Relation Age of Onset  . Hypertension Mother   . Cancer Father     prostate  . Heart disease Neg Hx   . Colon cancer Neg Hx   . Diabetes Brother     liver translplant and medication induced the diabetes  . Diabetes Paternal Uncle   . Diabetes Cousin  ALLERGIES:  has No Known Allergies.  MEDICATIONS:  Current Outpatient Prescriptions  Medication Sig Dispense Refill  . alendronate (FOSAMAX) 70 MG tablet Take 1 tablet (70 mg total) by mouth every 7 (seven) days. Take with a full glass of water on an empty stomach. 4 tablet 11  . aspirin 81 MG tablet Take 81 mg by mouth daily.      [name] atorvastatin (LIPITOR) 20 MG tablet TAKE 1 TABLET BY MOUTH DAILY 90 tablet 2  . Calcium Carb-Cholecalciferol (CALCIUM PLUS VITAMIN D3 PO) Take by mouth 2 (two) times daily.      . carvedilol (COREG) 12.5 MG tablet TAKE 1 TABLET(12.5 MG) BY MOUTH TWICE DAILY WITH A MEAL 60  tablet 1  . diphenhydrAMINE (BENADRYL ALLERGY) 25 mg capsule Take 25 mg by mouth every 6 (six) hours as needed for itching.    . fluorouracil (EFUDEX) 5 % cream APPLY ON THE SKIN AA BID FOR 2 WEEKS  0  . lisinopril (PRINIVIL,ZESTRIL) 5 MG tablet TAKE 1 TABLET BY MOUTH EVERY DAY 90 tablet 3  . traMADol (ULTRAM) 50 MG tablet Take 1 tablet (50 mg total) by mouth every 8 (eight) hours as needed. 30 tablet 2   No current facility-administered medications for this visit.    REVIEW OF SYSTEMS:   Constitutional: Denies fevers, chills or abnormal night sweats Eyes: Denies blurriness of vision, double vision or watery eyes Ears, nose, mouth, throat, and face: Denies mucositis or sore throat Respiratory: Denies cough, dyspnea or wheezes Cardiovascular: Denies palpitation, chest discomfort or lower extremity swelling Gastrointestinal:  Denies nausea, heartburn or change in bowel habits Skin: Denies abnormal skin rashes Lymphatics: Denies new lymphadenopathy or easy bruising Neurological:Denies numbness, tingling or new weaknesses Behavioral/Psych: Mood is stable, no new changes  Breast:  Denies any palpable lumps or discharge All other systems were reviewed with the patient and are negative.  PHYSICAL EXAMINATION: ECOG PERFORMANCE STATUS: 0 - Asymptomatic  Filed Vitals:   02/10/16 1248  BP: 119/66  Pulse: 61  Temp: 98.4 F (36.9 C)  Resp: 17   Filed Weights   02/10/16 1248  Weight: 128 lb 12.8 oz (58.423 kg)    GENERAL:alert, no distress and comfortable SKIN: skin color, texture, turgor are normal, no rashes or significant lesions EYES: normal, conjunctiva are pink and non-injected, sclera clear OROPHARYNX:no exudate, no erythema and lips, buccal mucosa, and tongue normal  NECK: supple, thyroid normal size, non-tender, without nodularity LYMPH:  no palpable lymphadenopathy in the cervical, axillary or inguinal LUNGS: clear to auscultation and percussion with normal breathing  effort HEART: regular rate & rhythm and no murmurs and no lower extremity edema ABDOMEN:abdomen soft, non-tender and normal bowel sounds Musculoskeletal:no cyanosis of digits and no clubbing  PSYCH: alert & oriented x 3 with fluent speech NEURO: no focal motor/sensory deficits BREAST: No palpable nodules in breast. Bruising related to the recent biopsy. No palpable axillary or supraclavicular lymphadenopathy (exam performed in the presence of a chaperone)   LABORATORY DATA:  I have reviewed the data as listed Lab Results  Component Value Date   WBC 6.4 02/10/2016   HGB 14.9 02/10/2016   HCT 45.5 02/10/2016   MCV 97.4 02/10/2016   PLT 195 02/10/2016   Lab Results  Component Value Date   NA 139 02/10/2016   K 4.9 02/10/2016   CL 104 05/08/2015   CO2 26 02/10/2016    RADIOGRAPHIC STUDIES: I have personally reviewed the radiological reports and agreed with the findings in the report.  ASSESSMENT AND PLAN:  Breast cancer of upper-outer quadrant of right female breast ([place]) Right breast biopsy 01/27/2016: DCIS with calcifications lower intermediate grade, ER 100%, PR 80%, Tis N0 stage 0; screening mammogram revealed right breast calcifications 8 mm  Pathology review: I discussed with the patient the difference between DCIS and invasive breast cancer. It is considered a precancerous lesion. DCIS is classified as a 0. It is generally detected through mammograms as calcifications. We discussed the significance of grades and its impact on prognosis. We also discussed the importance of ER and PR receptors and their implications to adjuvant treatment options. Prognosis of DCIS dependence on grade, comedo necrosis. It is anticipated that if not treated, 20-30% of DCIS can develop into invasive breast cancer.  Recommendation: 1. Breast conserving surgery vs participation in clinical trial COMET  2. Followed by adjuvant radiation therapy 3. Followed by antiestrogen therapy with tamoxifen 5  years  AFT 25 COMET Phase 3 clinical trial for low risk DCIS grade 1/2 PR positive, age greater than 40 randomized to surgery +/- radiation, +/- endocrine therapy versus active surveillance with +/- endocrine therapy surveillance with mammograms every 6 months for 5 years;patient's have option to decline elevated arm and still be followed on study   Tamoxifen counseling: We discussed the risks and benefits of tamoxifen. These include but not limited to insomnia, hot flashes, mood changes, vaginal dryness, and weight gain. Although rare, serious side effects including endometrial cancer, risk of blood clots were also discussed. We strongly believe that the benefits far outweigh the risks. Patient understands these risks and consented to starting treatment. Planned treatment duration is 5 years.  Patient will review the clinical trial and will make a decision. Depending on the clinical trial decision, I will plan to see her.   All questions were answered. The patient knows to call the clinic with any problems, questions or concerns.    Rulon Eisenmenger, MD 02/10/2016

## 2016-02-10 NOTE — Progress Notes (Signed)
Met with patient and daughter at [facility] to introduce [facility] team/resources, reviewing distress screen per protocol.  The patient scored a 2 on the Psychosocial Distress Thermometer which indicates mild distress.  Also assessed for distress and other psychosocial needs.  ONCBCN DISTRESS SCREENING 02/10/2016  Screening Type Initial Screening  Distress experienced in past week (1-10) 2  Emotional problem type Nervousness/Anxiety  Information Concerns Type Lack of info about diagnosis;Lack of info about treatment;Lack of info about complementary therapy choices;Lack of info about maintaining fitness  Referral to support programs Yes   Counselor used open question to assess how client is feeling around her new diagnosis. Client reported getting the news she had breast cancer right before she left to take a trip to [place]. Counselor and client processed the stress of being abroad and getting the diagnosis. Counselor processed the client's and daughters stress around being gone, but how she is feeling more at ease after meeting with the doctors and having information about her diagnosis and treatment moving forward. Counselor explored coping and support, and client reported her family and faith is her biggest helper during difficult times.    Follow up needed:  [N]  Pt is aware of ongoing Support Team availability and contact information. Please also page as needs arise/circumstances change.  Thank you.  Wendall Papa, MS, [place], LP[facility] Counseling Intern-Department for Spiritual Care and [place], [place], [place]

## 2016-02-15 ENCOUNTER — Other Ambulatory Visit: Payer: Self-pay | Admitting: *Deleted

## 2016-02-15 ENCOUNTER — Telehealth: Payer: Self-pay | Admitting: *Deleted

## 2016-02-15 NOTE — Telephone Encounter (Signed)
Spoke with patient to follow up from [facility] 02/10/16.  Denies any questions or concerns at this time.  Encouraged her to call with any needs or concerns.

## 2016-02-17 ENCOUNTER — Ambulatory Visit: Payer: Medicare Other | Admitting: Cardiovascular Disease

## 2016-02-18 ENCOUNTER — Ambulatory Visit (INDEPENDENT_AMBULATORY_CARE_PROVIDER_SITE_OTHER): Payer: Medicare Other | Admitting: Cardiovascular Disease

## 2016-02-18 ENCOUNTER — Encounter: Payer: Self-pay | Admitting: Cardiovascular Disease

## 2016-02-18 VITALS — BP 108/64 | HR 71 | Ht 64.0 in | Wt 127.4 lb

## 2016-02-18 DIAGNOSIS — C50411 Malignant neoplasm of upper-outer quadrant of right female breast: Secondary | ICD-10-CM

## 2016-02-18 DIAGNOSIS — I5181 Takotsubo syndrome: Secondary | ICD-10-CM

## 2016-02-18 DIAGNOSIS — I10 Essential (primary) hypertension: Secondary | ICD-10-CM

## 2016-02-18 NOTE — Patient Instructions (Signed)
Medication Instructions:  1) Xanax 0.25mg  1 tab by mouth twice daily as needed for anxiety.  Labwork: None  Testing/Procedures: None  Follow-Up: Your physician wants you to follow-up in: 1 year with Dr. Acie Fredrickson.  You will receive a reminder letter in the mail two months in advance. If you don't receive a letter, please call our office to schedule the follow-up appointment.   Any Other Special Instructions Will Be Listed Below (If Applicable).     If you need a refill on your cardiac medications before your next appointment, please call your pharmacy.

## 2016-02-18 NOTE — Progress Notes (Signed)
Jocelyn Day Date of Birth  May 29, 1941 [place] [address]    Jocelyn Day   Jocelyn Day, Jocelyn Day  [id number]    Jocelyn Day, Jocelyn Day  [id number] [phone]  Fax  [phone]  [phone]  Fax [phone]  Problem List: 1.  Takosubo Syndrome - Feb, 2012, EF = 30%, echo in April, 2012 revealed EF of 45-50%,   EF 60% in August 2013.    History of Present Illness:  Jocelyn Day is a 75 yo with a hx of Takosubo Syndrome.  She has not had any recurrent chest pain.  Her husband died of esophageal cancer this past December. She is exercising some.    Jan 18, 2013:  She is doing well.    No CP. No dyspnea.    January 20, 2014:  [name] is doing well.  Has had some shoulder problems.   February 17, 2015:  [name] is doing well. No episodes of CP , no dyspnea , Had some nausea and dizziness during a Tai Chi work out  February 18, 2016:  [name] is doing well from a cardiac standpont. Has been found to had breast -  DCIS .   Scheduled for lumpectomy.   No plans for chemo or XRT at this point .  Breathing is good .     Current Outpatient Prescriptions on File Prior to Visit  Medication Sig Dispense Refill  . alendronate (FOSAMAX) 70 MG tablet Take 1 tablet (70 mg total) by mouth every 7 (seven) days. Take with a full glass of water on an empty stomach. 4 tablet 11  . aspirin 81 MG tablet Take 81 mg by mouth daily.      [name] atorvastatin (LIPITOR) 20 MG tablet TAKE 1 TABLET BY MOUTH DAILY 90 tablet 2  . Calcium Carb-Cholecalciferol (CALCIUM PLUS VITAMIN D3 PO) Take by mouth 2 (two) times daily.      . carvedilol (COREG) 12.5 MG tablet TAKE 1 TABLET(12.5 MG) BY MOUTH TWICE DAILY WITH A MEAL 60 tablet 1  . diphenhydrAMINE (BENADRYL ALLERGY) 25 mg capsule Take 25 mg by mouth every 6 (six) hours as needed for itching.    [name] lisinopril (PRINIVIL,ZESTRIL) 5 MG tablet TAKE 1 TABLET BY MOUTH EVERY DAY 90 tablet 3   No current facility-administered medications on file  prior to visit.    No Known Allergies  Past Medical History  Diagnosis Date  . ACS (acute coronary syndrome) ([place]) 10/20/2010    Normal coronaries per cath 2/12  . Calcium oxalate renal stones   . Osteoporosis 01/2015    T score -2.5  . Atrophic vaginitis   . Insomnia   . Takotsubo cardiomyopathy 10/21/2010    Acute coronary syndrome/Takotsubo cardiomyopathy  . Cancer ([place])     skin cancer-basal cell  . Breast cancer of upper-outer quadrant of right female breast ([place]) 01/28/2016    Past Surgical History  Procedure Laterality Date  . Cholecystectomy  1993  . Cesarean section  1974  . Cesarean section  1977  . Dilation and curettage of uterus  1990  . Kidney stone surgery  2010  . 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 Basal cell carinoma excised  2005  . Colonoscopy    . Polypectomy    . Breast biopsy  01/27/2016    Malignant    History  Smoking status  . Never Smoker   Smokeless tobacco  . Never Used    History  Alcohol Use  . 0.0 oz/week  . 0 Standard drinks or equivalent per week    Comment: once or twice  a month a glasses of wine    Family History  Problem Relation Age of Onset  . Hypertension Mother   . Cancer Father     prostate  . Heart disease Neg Hx   . Colon cancer Neg Hx   . Diabetes Brother     liver translplant and medication induced the diabetes  . Diabetes Paternal Uncle   . Diabetes Cousin   . Cervical cancer Sister     Reviw of Systems:  Reviewed in the HPI.  All other systems are negative.  Physical Exam: Blood pressure 108/64, pulse 71, height 5' 4"  (1.626 m), weight 127 lb 6.4 oz (57.788 kg). General: Well developed, well nourished, in no acute distress.  Head: Normocephalic, atraumatic, sclera non-icteric, mucus membranes are moist,   Neck: Supple. Negative for carotid bruits. JVD not  elevated.  Lungs: Clear bilaterally to auscultation without wheezes, rales, or rhonchi. Breathing is unlabored.  Heart: RRR with S1 S2. No murmurs, rubs, or gallops appreciated.  Abdomen: Soft, non-tender, non-distended with normoactive bowel sounds. No hepatomegaly. No rebound/guarding. No obvious abdominal masses.  Msk:  Strength and tone appear normal for age.  Extremities: No clubbing or cyanosis. No edema.  Distal pedal pulses are 2+ and equal bilaterally.  Neuro: Alert and oriented X 3. Moves all extremities spontaneously.  Psych:  Responds to questions appropriately with a normal affect.  ECG: February 18, 2016:  NSR at 71. Normal ECG  Assessment / Plan:   1.  Takosubo Syndrome - Feb, 2012, EF = 30%, echo in April, 2012 revealed EF of 45-50%,  EF is now 60% by echo She is completely asymptomatic from a cardiac standpoint .  Her last EF was normal.    She needs to have a lumpectomy.    She is at low risk for her upcoming breast surgery .  I gave her a script for Xanax 0.25 mg , # 15 for anxiety.  She is doing well .    Continue same meds.  I will see her in 1 year   Jocelyn Moores, MD  02/18/2016 11:01 AM    [facility] [place],  [place], [place]  [phone] Pager [phone] Phone: [phone]; Fax: [phone]

## 2016-03-01 ENCOUNTER — Encounter (HOSPITAL_COMMUNITY): Payer: Self-pay

## 2016-03-01 ENCOUNTER — Encounter (HOSPITAL_COMMUNITY)
Admission: RE | Admit: 2016-03-01 | Discharge: 2016-03-01 | Disposition: A | Discharge status: Home / Self Care | Payer: Medicare Other | Source: Ambulatory Visit | Attending: General Surgery | Admitting: General Surgery

## 2016-03-01 DIAGNOSIS — Z01812 Encounter for preprocedural laboratory examination: Secondary | ICD-10-CM | POA: Insufficient documentation

## 2016-03-01 HISTORY — DX: Essential (primary) hypertension: I10

## 2016-03-01 HISTORY — DX: Personal history of urinary calculi: Z87.442

## 2016-03-01 HISTORY — DX: Acute myocardial infarction, unspecified: I21.9

## 2016-03-01 LAB — COMPREHENSIVE METABOLIC PANEL
ALBUMIN: 4.1 g/dL (ref 3.5–5.0)
ALT: 25 U/L (ref 14–54)
ANION GAP: 5 (ref 5–15)
AST: 30 U/L (ref 15–41)
Alkaline Phosphatase: 59 U/L (ref 38–126)
BILIRUBIN TOTAL: 1 mg/dL (ref 0.3–1.2)
BUN: 12 mg/dL (ref 6–20)
CO2: 28 mmol/L (ref 22–32)
Calcium: 9.3 mg/dL (ref 8.9–10.3)
Chloride: 105 mmol/L (ref 101–111)
Creatinine, Ser: 0.91 mg/dL (ref 0.44–1.00)
GFR calc Af Amer: >60 mL/min (ref >60)
GFR calc non Af Amer: >60 mL/min (ref >60)
GLUCOSE: 104 mg/dL — AB (ref 65–99)
POTASSIUM: 4.6 mmol/L (ref 3.5–5.1)
SODIUM: 138 mmol/L (ref 135–145)
TOTAL PROTEIN: 6.9 g/dL (ref 6.5–8.1)

## 2016-03-01 LAB — CBC WITH DIFFERENTIAL/PLATELET
Basophils Absolute: 0 10*3/uL (ref 0.0–0.1)
Basophils Relative: 0 %
EOS PCT: 1 %
Eosinophils Absolute: 0.1 10*3/uL (ref 0.0–0.7)
HEMATOCRIT: 45.3 % (ref 36.0–46.0)
HEMOGLOBIN: 14.8 g/dL (ref 12.0–15.0)
LYMPHS ABS: 1.8 10*3/uL (ref 0.7–4.0)
LYMPHS PCT: 31 %
MCH: 32.5 pg (ref 26.0–34.0)
MCHC: 32.7 g/dL (ref 30.0–36.0)
MCV: 99.3 fL (ref 78.0–100.0)
MONO ABS: 0.4 10*3/uL (ref 0.1–1.0)
MONOS PCT: 6 %
Neutro Abs: 3.7 10*3/uL (ref 1.7–7.7)
Neutrophils Relative %: 62 %
Platelets: 171 10*3/uL (ref 150–400)
RBC: 4.56 MIL/uL (ref 3.87–5.11)
RDW: 12 % (ref 11.5–15.5)
WBC: 5.9 10*3/uL (ref 4.0–10.5)

## 2016-03-01 NOTE — Pre-Procedure Instructions (Addendum)
Jocelyn Day  03/01/2016      [facility] - [place], [place] - [address] AT [facility] [address] [place] [address] Phone: [phone] Fax: [phone]    Your procedure is scheduled on 03/08/16.  Report to [facility] Admitting at 530 A.M.  Call this number if you have problems the morning of surgery:  [phone]   Remember:  Do not eat food or drink liquids after midnight.  Take these medicines the morning of surgery with A SIP OF WATER carvedelol(coreg)    STOP all herbel meds, nsaids (aleve,naproxen,advil,ibuprofen) 5 days prior to surgery starting 03/03/16 including aspirin, all vitamins   Do not wear jewelry, make-up or nail polish.  Do not wear lotions, powders, or perfumes.  You may wear deoderant.  Do not shave 48 hours prior to surgery.  Men may shave face and neck.  Do not bring valuables to the hospital.  [facility] is not responsible for any belongings or valuables.  Contacts, dentures or bridgework may not be worn into surgery.  Leave your suitcase in the car.  After surgery it may be brought to your room.  For patients admitted to the hospital, discharge time will be determined by your treatment team.  Patients discharged the day of surgery will not be allowed to drive home.   Name and phone number of your driver:   Special instructions:   Special Instructions: [place] - Preparing for Surgery  Before surgery, you can play an important role.  Because skin is not sterile, your skin needs to be as free of germs as possible.  You can reduce the number of germs on you skin by washing with CHG (chlorahexidine gluconate) soap before surgery.  CHG is an antiseptic cleaner which kills germs and bonds with the skin to continue killing germs even after washing.  Please DO NOT use if you have an allergy to CHG or antibacterial soaps.  If your skin becomes reddened/irritated stop using the CHG and inform  your nurse when you arrive at Short Stay.  Do not shave (including legs and underarms) for at least 48 hours prior to the first CHG shower.  You may shave your face.  Please follow these instructions carefully:   1.  Shower with CHG Soap the night before surgery and the morning of Surgery.  2.  If you choose to wash your hair, wash your hair first as usual with your normal shampoo.  3.  After you shampoo, rinse your hair and body thoroughly to remove the Shampoo.  4.  Use CHG as you would any other liquid soap.  You can apply chg directly  to the skin and wash gently with scrungie or a clean washcloth.  5.  Apply the CHG Soap to your body ONLY FROM THE NECK DOWN.  Do not use on open wounds or open sores.  Avoid contact with your eyes ears, mouth and genitals (private parts).  Wash genitals (private parts)       with your normal soap.  6.  Wash thoroughly, paying special attention to the area where your surgery will be performed.  7.  Thoroughly rinse your body with warm water from the neck down.  8.  DO NOT shower/wash with your normal soap after using and rinsing off the CHG Soap.  9.  Pat yourself dry with a clean towel.  10.  Wear clean pajamas.            11.  Place clean sheets on your bed the night of your first shower and do not sleep with pets.  Day of Surgery  Do not apply any lotions/deodorants the morning of surgery.  Please wear clean clothes to the hospital/surgery center.  Please read over the following fact sheets that you were given. Pain Booklet and Surgical Site Infection Prevention

## 2016-03-02 NOTE — Progress Notes (Signed)
Anesthesia Chart Review:  Pt is a 75 year old female scheduled for R breast lumpectomy with radioactive seed localization on 03/08/2016 with [name], MD.   Cardiologist is [name], MD who has cleared pt for surgery.   PMH includes:  Takotsubo cardiomyopathy, HTN, breast cancer. Never smoker. BMI 22  Medications include: ASA, lipitor, carvedilol, lisinopril  Preoperative labs reviewed.    EKG 02/18/16: NSR  Echo 04/09/12:  - Left ventricle: The cavity size was normal. Wall thickness was normal. Systolic function was normal. The estimated ejection fraction was in the range of 55% to 60%. Wall motion was normal; there were no regional wall motion abnormalities. Doppler parameters are consistent with abnormal left ventricular relaxation (grade 1 diastolic dysfunction). - Mitral valve: Mild regurgitation. - Atrial septum: There was an atrial septal aneurysm.  Cardiac cath 10/21/10:  1. Normal coronary arteries. 2. Apical ballooning syndrome consistent with tako-tsubo cardiomyopathy with an ejection fraction of 30%.  If no changes, I anticipate pt can proceed with surgery as scheduled.   Willeen Cass, FNP-BC [facility] Short Stay Surgical Center/Anesthesiology Phone: [phone] 03/02/2016 11:48 AM

## 2016-03-06 NOTE — H&P (Signed)
Jocelyn Day Location: [facility] Patient #: [id number] DOB: 08-19-1941 Undefined / Language: Cleophus Molt / Race: White Female   History of Present Illness  . This is a pleasant, 75 year old Caucasian female, referred to the [facility] by Dr. Ladonna Snide at [facility] mammography for evaluation and management of ductal carcinoma in situ right breast, upper outer quadrant. She is being evaluated in the [facility] today by Dr. Lindi Adie, Dr. [name] Come and me. Her PCP is Dr. Andrey Farmer. Her gynecologist is Dr. Donalynn Furlong. Her cardiologist is Dr. [name].  She has no prior history of breast problems. She gets annual mammograms. Recent imaging studies show category B density, 8 mm area of suspicious calcifications in the right breast upper outer quadrant, middle depth. Image guided biopsy shows low to intermediate grade ductal carcinoma in situ. Estrogen and progesterone receptors are strongly positive.  Her daughter, who is a Music therapist at [employer], is here with her throughout the encounter.  Past history is significant for Takotsubo cardiomyopathy. She's had a cardiac cath and she says this is normal. She sees Dr. Acie Fredrickson annually and is set to see him on June 29. She had a history of kidney stones. Laparoscopic cholecystectomy. Cesarean section. Hypertension. Regular colonoscopies have been negative. Her health is actually good.  Family history is negative for breast, ovarian, colon, or pancreatic cancer. Father had prostate cancer.  Her husband died of esophageal cancer in Dec 20, 2010. She lives alone and is independent. She exercises regularly. Denies tobacco. Drinks wine occasionally. Has 2 children. Retired but used to work for [employer] and with a young life great.  We had a long talk about this surgical management of her disease. We talked about lumpectomy versus mastectomy with or without reconstruction. We talked about the possibility of radiation therapy. We talked  about the probability of needing antiestrogen therapy. She has been counseled regarding the COMET trial. She does not want to be randomized but prefers to go ahead with upfront surgery but is willing to register for data acquisition.  She will be scheduled for right breast lumpectomy with radioactive seed localization. I discussed the indications, details, techniques, and numerous risk of the surgery with her and her daughter. She is aware of the risk of bleeding, infection, cosmetic deformity, reoperation for positive margins, chronic pain from nerve damage, cardiac pulmonary and thromboembolic problems. She understands all these issues well. At this time all of her questions are answered. She agrees with this plan.  My office will call her tomorrow to begin the scheduling. She will discuss the, trial with the research nurse. She will follow-up with Dr. Lindi Adie and Dr. [name] Come postop.   Other Problems  High blood pressure Hypercholesterolemia Kidney Stone Melanoma  Past Surgical History  Breast Biopsy Right. Cesarean Section - Multiple Gallbladder Surgery - Laparoscopic  Diagnostic Studies History  Colonoscopy 1-5 years ago Mammogram within last year Pap Smear 1-5 years ago  Medication History  Medications Reconciled  Social History Alcohol use Occasional alcohol use. Caffeine use Coffee. No drug use Tobacco use Never smoker.  Family History Alcohol Abuse Brother. Cervical Cancer Sister. Melanoma Brother. Prostate Cancer Father. Respiratory Condition Mother.  Pregnancy / Birth History Age at menarche 71 years. Age of menopause 51-55 Contraceptive History Intrauterine device, Oral contraceptives. Gravida 2 Length (months) of breastfeeding 3-6 Maternal age 53-35 Para 2    Review of Systems  General Not Present- Appetite Loss, Chills, Fatigue, Fever, Night Sweats, Weight Gain and Weight Loss. Skin Not Present- Change in Wart/Mole,  Dryness, Hives,  Jaundice, New Lesions, Non-Healing Wounds, Rash and Ulcer. HEENT Present- Wears glasses/contact lenses. Not Present- Earache, Hearing Loss, Hoarseness, Nose Bleed, Oral Ulcers, Ringing in the Ears, Seasonal Allergies, Sinus Pain, Sore Throat, Visual Disturbances and Yellow Eyes. Respiratory Not Present- Bloody sputum, Chronic Cough, Difficulty Breathing, Snoring and Wheezing. Breast Not Present- Breast Mass, Breast Pain, Nipple Discharge and Skin Changes. Cardiovascular Not Present- Chest Pain, Difficulty Breathing Lying Down, Leg Cramps, Palpitations, Rapid Heart Rate, Shortness of Breath and Swelling of Extremities. Gastrointestinal Not Present- Abdominal Pain, Bloating, Bloody Stool, Change in Bowel Habits, Chronic diarrhea, Constipation, Difficulty Swallowing, Excessive gas, Gets full quickly at meals, Hemorrhoids, Indigestion, Nausea, Rectal Pain and Vomiting. Female Genitourinary Not Present- Frequency, Nocturia, Painful Urination, Pelvic Pain and Urgency. Musculoskeletal Not Present- Back Pain, Joint Pain, Joint Stiffness, Muscle Pain, Muscle Weakness and Swelling of Extremities. Neurological Not Present- Decreased Memory, Fainting, Headaches, Numbness, Seizures, Tingling, Tremor, Trouble walking and Weakness. Psychiatric Not Present- Anxiety, Bipolar, Change in Sleep Pattern, Depression, Fearful and Frequent crying. Endocrine Not Present- Cold Intolerance, Excessive Hunger, Hair Changes, Heat Intolerance, Hot flashes and New Diabetes. Hematology Not Present- Blood Thinners, Easy Bruising, Excessive bleeding, Gland problems, HIV and Persistent Infections.   Physical Exam  General Mental Status-Alert. General Appearance-Consistent with stated age. Hydration-Well hydrated. Voice-Normal.  Head and Neck Head-normocephalic, atraumatic with no lesions or palpable masses. Trachea-midline. Thyroid Gland Characteristics - normal size and consistency.  Eye Eyeball  - Bilateral-Extraocular movements intact. Sclera/Conjunctiva - Bilateral-No scleral icterus.  Chest and Lung Exam Chest and lung exam reveals -quiet, even and easy respiratory effort with no use of accessory muscles and on auscultation, normal breath sounds, no adventitious sounds and normal vocal resonance. Inspection Chest Wall - Normal. Back - normal.  Breast Note: Breasts are medium-sized. Small amount of ecchymoses and a tiny hematoma in the upper outer quadrant of the right breast. No other skin change or mass in either breast. No axillary adenopathy.   Cardiovascular Cardiovascular examination reveals -normal heart sounds, regular rate and rhythm with no murmurs and normal pedal pulses bilaterally.  Abdomen Note: Soft and nontender. No organomegaly. Scars from laparoscopic cholecystectomy and C-section. No hernias. No tenderness.   Neurologic Neurologic evaluation reveals -alert and oriented x 3 with no impairment of recent or remote memory. Mental Status-Normal.  Musculoskeletal Normal Exam - Left-Upper Extremity Strength Normal and Lower Extremity Strength Normal. Normal Exam - Right-Upper Extremity Strength Normal and Lower Extremity Strength Normal.  Lymphatic Head & Neck  General Head & Neck Lymphatics: Bilateral - Description - Normal. Axillary  General Axillary Region: Bilateral - Description - Normal. Tenderness - Non Tender. Femoral & Inguinal  Generalized Femoral & Inguinal Lymphatics: Bilateral - Description - Normal. Tenderness - Non Tender.    Assessment & Plan  PRIMARY CANCER OF UPPER OUTER QUADRANT OF RIGHT FEMALE BREAST (C50.411)     Your imaging studies and biopsy show an area of ductal carcinoma in situ of the right breast, upper outer quadrant, low to intermediate grade. Estrogen and progesterone receptors are positive  We have discussed the standard of care which includes lumpectomy versus mastectomy. You prefer  lumpectomy We have discussed the possibility of adjuvant radiation therapy which you may or may not need. We have discussed antiestrogen therapy, which she will likely be offered  We have discussed the COMET trial, and you have stated that you would like to enter the trial for data acquisition purposes, but that you would like to go ahead with surgery at this time. The research  nurse will discuss this with you.  We have discussed the indications, techniques, and risks of right breast lumpectomy with radioactive seed localization Dr. Darrel Hoover office will call you tomorrow to begin the scheduling process.  TAKOTSUBO CARDIOMYOPATHY (I51.81) HYPERTENSION, BENIGN (I10) NEPHROLITHIASIS (N20.0) HISTORY OF LAPAROSCOPIC CHOLECYSTECTOMY (Z90.49)    Edsel Petrin. Dalbert Batman, M.D., [facility] General and Minimally invasive Surgery Breast and Colorectal Surgery Office:   [phone] Pager:   [phone]

## 2016-03-07 DIAGNOSIS — C50411 Malignant neoplasm of upper-outer quadrant of right female breast: Secondary | ICD-10-CM | POA: Diagnosis not present

## 2016-03-08 ENCOUNTER — Ambulatory Visit (HOSPITAL_COMMUNITY): Payer: Medicare Other | Admitting: Certified Registered Nurse Anesthetist

## 2016-03-08 ENCOUNTER — Ambulatory Visit (HOSPITAL_COMMUNITY): Payer: Medicare Other | Admitting: Emergency Medicine

## 2016-03-08 ENCOUNTER — Ambulatory Visit (HOSPITAL_COMMUNITY)
Admission: RE | Admit: 2016-03-08 | Discharge: 2016-03-08 | Disposition: A | Discharge status: Home / Self Care | Payer: Medicare Other | Source: Ambulatory Visit | Attending: General Surgery | Admitting: General Surgery

## 2016-03-08 ENCOUNTER — Encounter (HOSPITAL_COMMUNITY)
Admission: RE | Disposition: A | Discharge status: Home / Self Care | Payer: Self-pay | Source: Ambulatory Visit | Attending: General Surgery

## 2016-03-08 ENCOUNTER — Encounter (HOSPITAL_COMMUNITY): Payer: Self-pay | Admitting: Surgery

## 2016-03-08 DIAGNOSIS — I5181 Takotsubo syndrome: Secondary | ICD-10-CM | POA: Diagnosis not present

## 2016-03-08 DIAGNOSIS — I10 Essential (primary) hypertension: Secondary | ICD-10-CM | POA: Diagnosis not present

## 2016-03-08 DIAGNOSIS — R921 Mammographic calcification found on diagnostic imaging of breast: Secondary | ICD-10-CM | POA: Diagnosis not present

## 2016-03-08 DIAGNOSIS — F419 Anxiety disorder, unspecified: Secondary | ICD-10-CM | POA: Diagnosis not present

## 2016-03-08 DIAGNOSIS — E7800 Pure hypercholesterolemia, unspecified: Secondary | ICD-10-CM | POA: Insufficient documentation

## 2016-03-08 DIAGNOSIS — D0591 Unspecified type of carcinoma in situ of right breast: Secondary | ICD-10-CM | POA: Diagnosis not present

## 2016-03-08 DIAGNOSIS — C50411 Malignant neoplasm of upper-outer quadrant of right female breast: Secondary | ICD-10-CM | POA: Diagnosis present

## 2016-03-08 DIAGNOSIS — D0511 Intraductal carcinoma in situ of right breast: Secondary | ICD-10-CM | POA: Diagnosis not present

## 2016-03-08 HISTORY — PX: BREAST LUMPECTOMY WITH RADIOACTIVE SEED LOCALIZATION: SHX6424

## 2016-03-08 SURGERY — BREAST LUMPECTOMY WITH RADIOACTIVE SEED LOCALIZATION
Anesthesia: General | Site: Breast | Laterality: Right

## 2016-03-08 MED ORDER — CHLORHEXIDINE GLUCONATE CLOTH 2 % EX PADS: 6.0000 | MEDICATED_PAD | Freq: Once | CUTANEOUS | Status: DC

## 2016-03-08 MED ORDER — LACTATED RINGERS IV SOLN
INTRAVENOUS | Status: DC | PRN
  Administered 2016-03-08: 07:00:00 via INTRAVENOUS

## 2016-03-08 MED ORDER — HYDROCODONE-ACETAMINOPHEN 5-325 MG PO TABS: 1.0000 | ORAL_TABLET | Freq: Four times a day (QID) | ORAL | Status: DC | PRN

## 2016-03-08 MED ORDER — PROPOFOL 10 MG/ML IV BOLUS
INTRAVENOUS | Status: DC | PRN
  Administered 2016-03-08: 160 mg via INTRAVENOUS

## 2016-03-08 MED ORDER — BUPIVACAINE-EPINEPHRINE 0.25% -1:200000 IJ SOLN
INTRAMUSCULAR | Status: DC | PRN
  Administered 2016-03-08: 8 mL

## 2016-03-08 MED ORDER — PHENYLEPHRINE 40 MCG/ML (10ML) SYRINGE FOR IV PUSH (FOR BLOOD PRESSURE SUPPORT)
PREFILLED_SYRINGE | INTRAVENOUS | Status: AC
  Filled 2016-03-08: qty 10

## 2016-03-08 MED ORDER — PHENYLEPHRINE HCL 10 MG/ML IJ SOLN
INTRAMUSCULAR | Status: DC | PRN
  Administered 2016-03-08: 80 ug via INTRAVENOUS

## 2016-03-08 MED ORDER — ARTIFICIAL TEARS OP OINT
TOPICAL_OINTMENT | OPHTHALMIC | Status: AC
  Filled 2016-03-08: qty 3.5

## 2016-03-08 MED ORDER — FENTANYL CITRATE (PF) 250 MCG/5ML IJ SOLN
INTRAMUSCULAR | Status: AC
  Filled 2016-03-08: qty 5

## 2016-03-08 MED ORDER — DEXAMETHASONE SODIUM PHOSPHATE 10 MG/ML IJ SOLN
INTRAMUSCULAR | Status: DC | PRN
  Administered 2016-03-08: 10 mg via INTRAVENOUS

## 2016-03-08 MED ORDER — LIDOCAINE 2% (20 MG/ML) 5 ML SYRINGE
INTRAMUSCULAR | Status: AC
  Filled 2016-03-08: qty 5

## 2016-03-08 MED ORDER — CEFAZOLIN SODIUM-DEXTROSE 2-4 GM/100ML-% IV SOLN
2.0000 g | INTRAVENOUS | Status: AC
  Administered 2016-03-08: 2 g via INTRAVENOUS
  Filled 2016-03-08: qty 100

## 2016-03-08 MED ORDER — DEXAMETHASONE SODIUM PHOSPHATE 10 MG/ML IJ SOLN
INTRAMUSCULAR | Status: AC
  Filled 2016-03-08: qty 1

## 2016-03-08 MED ORDER — LIDOCAINE HCL (CARDIAC) 20 MG/ML IV SOLN
INTRAVENOUS | Status: DC | PRN
  Administered 2016-03-08: 100 mg via INTRAVENOUS

## 2016-03-08 MED ORDER — GABAPENTIN 300 MG PO CAPS
300.0000 mg | ORAL_CAPSULE | ORAL | Status: AC
  Administered 2016-03-08: 300 mg via ORAL
  Filled 2016-03-08: qty 1

## 2016-03-08 MED ORDER — 0.9 % SODIUM CHLORIDE (POUR BTL) OPTIME
TOPICAL | Status: DC | PRN
  Administered 2016-03-08: 1000 mL

## 2016-03-08 MED ORDER — MIDAZOLAM HCL 5 MG/5ML IJ SOLN
INTRAMUSCULAR | Status: DC | PRN
  Administered 2016-03-08: 2 mg via INTRAVENOUS

## 2016-03-08 MED ORDER — ACETAMINOPHEN 500 MG PO TABS
1000.0000 mg | ORAL_TABLET | ORAL | Status: AC
Start: 2016-03-08 — End: 2016-03-08
  Administered 2016-03-08: 1000 mg via ORAL
  Filled 2016-03-08: qty 2

## 2016-03-08 MED ORDER — EPHEDRINE 5 MG/ML INJ
INTRAVENOUS | Status: AC
  Filled 2016-03-08: qty 10

## 2016-03-08 MED ORDER — MIDAZOLAM HCL 2 MG/2ML IJ SOLN
INTRAMUSCULAR | Status: AC
  Filled 2016-03-08: qty 2

## 2016-03-08 MED ORDER — EPHEDRINE SULFATE 50 MG/ML IJ SOLN
INTRAMUSCULAR | Status: DC | PRN
  Administered 2016-03-08 (×2): 5 mg via INTRAVENOUS

## 2016-03-08 MED ORDER — PROPOFOL 10 MG/ML IV BOLUS
INTRAVENOUS | Status: AC
  Filled 2016-03-08: qty 20

## 2016-03-08 MED ORDER — ONDANSETRON HCL 4 MG/2ML IJ SOLN
INTRAMUSCULAR | Status: AC
  Filled 2016-03-08: qty 2

## 2016-03-08 MED ORDER — FENTANYL CITRATE (PF) 100 MCG/2ML IJ SOLN
INTRAMUSCULAR | Status: DC | PRN
  Administered 2016-03-08: 50 ug via INTRAVENOUS

## 2016-03-08 MED ORDER — ONDANSETRON HCL 4 MG/2ML IJ SOLN
INTRAMUSCULAR | Status: DC | PRN
  Administered 2016-03-08: 4 mg via INTRAVENOUS

## 2016-03-08 MED ORDER — BUPIVACAINE-EPINEPHRINE (PF) 0.25% -1:200000 IJ SOLN
INTRAMUSCULAR | Status: AC
  Filled 2016-03-08: qty 30

## 2016-03-08 SURGICAL SUPPLY — 47 items
ADH SKN CLS APL DERMABOND .7 (GAUZE/BANDAGES/DRESSINGS) ×1
APPLIER CLIP 9.375 MED OPEN (MISCELLANEOUS) ×2
APR CLP MED 9.3 20 MLT OPN (MISCELLANEOUS) ×1
BINDER BREAST LRG (GAUZE/BANDAGES/DRESSINGS) ×1 IMPLANT
BINDER BREAST XLRG (GAUZE/BANDAGES/DRESSINGS) IMPLANT
BLADE SURG 15 STRL LF DISP TIS (BLADE) ×1 IMPLANT
BLADE SURG 15 STRL SS (BLADE) ×2
CANISTER SUCTION 2500CC (MISCELLANEOUS) ×2 IMPLANT
CHLORAPREP W/TINT 26ML (MISCELLANEOUS) ×2 IMPLANT
CLIP APPLIE 9.375 MED OPEN (MISCELLANEOUS) IMPLANT
COVER PROBE W GEL 5X96 (DRAPES) ×2 IMPLANT
COVER SURGICAL LIGHT HANDLE (MISCELLANEOUS) ×2 IMPLANT
DERMABOND ADVANCED (GAUZE/BANDAGES/DRESSINGS) ×1
DERMABOND ADVANCED .7 DNX12 (GAUZE/BANDAGES/DRESSINGS) ×1 IMPLANT
DEVICE DUBIN SPECIMEN MAMMOGRA (MISCELLANEOUS) ×2 IMPLANT
DRAPE CHEST BREAST 15X10 FENES (DRAPES) ×2 IMPLANT
DRAPE UTILITY XL STRL (DRAPES) ×2 IMPLANT
DRSG PAD ABDOMINAL 8X10 ST (GAUZE/BANDAGES/DRESSINGS) ×2 IMPLANT
ELECT CAUTERY BLADE 6.4 (BLADE) ×2 IMPLANT
ELECT REM PT RETURN 9FT ADLT (ELECTROSURGICAL) ×2
ELECTRODE REM PT RTRN 9FT ADLT (ELECTROSURGICAL) ×1 IMPLANT
GLOVE EUDERMIC 7 POWDERFREE (GLOVE) ×2 IMPLANT
GOWN STRL REUS W/ TWL LRG LVL3 (GOWN DISPOSABLE) ×1 IMPLANT
GOWN STRL REUS W/ TWL XL LVL3 (GOWN DISPOSABLE) ×1 IMPLANT
GOWN STRL REUS W/TWL LRG LVL3 (GOWN DISPOSABLE) ×2
GOWN STRL REUS W/TWL XL LVL3 (GOWN DISPOSABLE) ×2
ILLUMINATOR WAVEGUIDE N/F (MISCELLANEOUS) IMPLANT
KIT BASIN OR (CUSTOM PROCEDURE TRAY) ×2 IMPLANT
KIT MARKER MARGIN INK (KITS) ×2 IMPLANT
LIGHT WAVEGUIDE WIDE FLAT (MISCELLANEOUS) IMPLANT
NDL HYPO 25X1 1.5 SAFETY (NEEDLE) ×1 IMPLANT
NEEDLE HYPO 25X1 1.5 SAFETY (NEEDLE) ×2 IMPLANT
NS IRRIG 1000ML POUR BTL (IV SOLUTION) ×1 IMPLANT
PACK SURGICAL SETUP 50X90 (CUSTOM PROCEDURE TRAY) ×2 IMPLANT
PAD ABD 8X10 STRL (GAUZE/BANDAGES/DRESSINGS) ×1 IMPLANT
PENCIL BUTTON HOLSTER BLD 10FT (ELECTRODE) ×2 IMPLANT
SPONGE GAUZE 4X4 12PLY STER LF (GAUZE/BANDAGES/DRESSINGS) ×1 IMPLANT
SPONGE LAP 18X18 X RAY DECT (DISPOSABLE) ×2 IMPLANT
SUT MNCRL AB 4-0 PS2 18 (SUTURE) ×2 IMPLANT
SUT SILK 2 0 SH (SUTURE) ×2 IMPLANT
SUT VIC AB 3-0 SH 18 (SUTURE) ×2 IMPLANT
SYR BULB 3OZ (MISCELLANEOUS) ×2 IMPLANT
SYR CONTROL 10ML LL (SYRINGE) ×2 IMPLANT
TOWEL OR 17X24 6PK STRL BLUE (TOWEL DISPOSABLE) ×2 IMPLANT
TOWEL OR 17X26 10 PK STRL BLUE (TOWEL DISPOSABLE) ×2 IMPLANT
TUBE CONNECTING 12X1/4 (SUCTIONS) ×2 IMPLANT
YANKAUER SUCT BULB TIP NO VENT (SUCTIONS) ×2 IMPLANT

## 2016-03-08 NOTE — Anesthesia Preprocedure Evaluation (Signed)
Anesthesia Evaluation  Patient identified by MRN, date of birth, ID band Patient awake    Reviewed: Allergy & Precautions, NPO status , Patient's Chart, lab work & pertinent test results  Airway Mallampati: I  TM Distance: >3 FB Neck ROM: Full    Dental   Pulmonary    Pulmonary exam normal        Cardiovascular hypertension, Pt. on medications Normal cardiovascular exam  2012 Takashubo cardiomyopathy EF to 20% now normal.   Neuro/Psych Anxiety    GI/Hepatic   Endo/Other    Renal/GU      Musculoskeletal   Abdominal   Peds  Hematology   Anesthesia Other Findings   Reproductive/Obstetrics                             Anesthesia Physical Anesthesia Plan  ASA: II  Anesthesia Plan: General   Post-op Pain Management:    Induction: Intravenous  Airway Management Planned: LMA  Additional Equipment:   Intra-op Plan:   Post-operative Plan: Extubation in OR  Informed Consent: I have reviewed the patients History and Physical, chart, labs and discussed the procedure including the risks, benefits and alternatives for the proposed anesthesia with the patient or authorized representative who has indicated his/her understanding and acceptance.     Plan Discussed with: CRNA and Surgeon  Anesthesia Plan Comments:         Anesthesia Quick Evaluation

## 2016-03-08 NOTE — Op Note (Signed)
Patient Name:           Jocelyn Day   Date of Surgery:        03/08/2016  Pre op Diagnosis:      Receptor positive ductal carcinoma in situ right breast, upper outer quadrant   Post op Diagnosis:    Same  Procedure:                 Right breast lumpectomy with radioactive seed localization  Surgeon:                     Edsel Petrin. Dalbert Batman, M.D., FACS  Assistant:                      OR staff  Operative Indications:   This is a pleasant, 75 year old Caucasian female, referred to the [facility] by Dr. Ladonna Snide at [facility] mammography for evaluation and management of ductal carcinoma in situ right breast, upper outer quadrant. She was evaluated in the [facility]  by Dr. Lindi Adie, Dr. [name] Come and me. Her PCP is Dr. Andrey Farmer. Her gynecologist is Dr. Donalynn Furlong. Her cardiologist is Dr. [name].      She has no prior history of breast problems. She gets annual mammograms. Recent imaging studies show category B density, 8 mm area of suspicious calcifications in the right breast upper outer quadrant, middle depth. Image guided biopsy shows low to intermediate grade ductal carcinoma in situ. Estrogen and progesterone receptors are strongly positive.       Past history is significant for Takotsubo cardiomyopathy. She's had a cardiac cath and she says this is normal. She sees Dr. Acie Fredrickson annually and he reports that she is at low risk for this procedure. [name] Her health is actually good.      Family history is negative for breast, ovarian, colon, or pancreatic cancer. Father had prostate cancer.      We had a long talk about this surgical management of her disease. We talked about lumpectomy versus mastectomy with or without reconstruction. We talked about the possibility of radiation therapy. We talked about the probability of needing antiestrogen therapy.       She will be scheduled for right breast lumpectomy with radioactive seed localization. I discussed the indications, details, techniques,  and numerous risk of the surgery with her.  She is brought to the operating room electively.[name]  Operative Findings:       The radioactive seed was placed yesterday.  I could hear the radioactive signaling in the holding area using the neoprobe in the upper outer quadrant.  The biopsy marker clip was in the center of the intended area of excision.  These radiated radioactive seed was a few millimeters away and so I took extra tissue anterior and medial to account for this.  The specimen mammogram looked very good with the marker clip in the center of the specimen and the radioactive seed nearby.  The posterior margin was the pectoralis muscle and the axilla.  Procedure in Detail:          Following the induction of general LMA anesthesia the patient's right breast was prepped and draped in sterile fashion.  Surgical timeout was performed and intravenous antibiotics were given.  0.5% Marcaine with epinephrine was used as a local infiltration anesthetic.  Using the neoprobe I designed a curvilinear incision high in the upper outer quadrant of the right breast.  Almost to the axilla.  Incision was made.  I dissected  down into the breast tissue and around the radioactive signal as described above using the neoprobe.  The specimen was removed and marked with silk sutures and a 6 color ink kit to orient the pathologist.  Specimen mammogram looked very good.  After discussing this with the radiologist the specimen was sent to the lab.     Hemostasis was excellent and achieved with electrocautery.  Metal clips were placed at 5 cardinal positions of the lumpectomy cavity.  Lumpectomy cavity was closed in layers with interrupted 3-0 Vicryl and the skin closed running subcutaneous 4-0 Monocryl and Dermabond.  Dry bandages and a breast binder were placed.  The patient tolerated the procedure well was taken to PACU in stable condition.  EBL 10 mL.  Counts correct.  Complications none.     Edsel Petrin. Dalbert Batman, M.D.,  FACS General and Minimally Invasive Surgery Breast and Colorectal Surgery  03/08/2016 8:31 AM

## 2016-03-08 NOTE — Interval H&P Note (Signed)
History and Physical Interval Note:  03/08/2016 6:23 AM  Jocelyn Day  has presented today for surgery, with the diagnosis of DCIS RIGHT BREAST  The various methods of treatment have been discussed with the patient and family. After consideration of risks, benefits and other options for treatment, the patient has consented to  Procedure(s): RIGHT BREAST LUMPECTOMY WITH RADIOACTIVE SEED LOCALIZATION (Right) as a surgical intervention .  The patient's history has been reviewed, patient examined, no change in status, stable for surgery.  I have reviewed the patient's chart and labs.  Questions were answered to the patient's satisfaction.     Adin Hector

## 2016-03-08 NOTE — Discharge Instructions (Signed)
[facility] °Office Phone Number [phone] ° °BREAST BIOPSY/ PARTIAL MASTECTOMY: POST OP INSTRUCTIONS ° °Always review your discharge instruction sheet given to you by the facility where your surgery was performed. ° °IF YOU HAVE DISABILITY OR FAMILY LEAVE FORMS, YOU MUST BRING THEM TO THE OFFICE FOR PROCESSING.  DO NOT GIVE THEM TO YOUR DOCTOR. ° °1. A prescription for pain medication may be given to you upon discharge.  Take your pain medication as prescribed, if needed.  If narcotic pain medicine is not needed, then you may take acetaminophen (Tylenol) or ibuprofen (Advil) as needed. °2. Take your usually prescribed medications unless otherwise directed °3. If you need a refill on your pain medication, please contact your pharmacy.  They will contact our office to request authorization.  Prescriptions will not be filled after 5pm or on week-ends. °4. You should eat very light the first 24 hours after surgery, such as soup, crackers, pudding, etc.  Resume your normal diet the day after surgery. °5. Most patients will experience some swelling and bruising in the breast.  Ice packs and a good support bra will help.  Swelling and bruising can take several days to resolve.  °6. It is common to experience some constipation if taking pain medication after surgery.  Increasing fluid intake and taking a stool softener will usually help or prevent this problem from occurring.  A mild laxative (Milk of Magnesia or Miralax) should be taken according to package directions if there are no bowel movements after 48 hours. °7. Unless discharge instructions indicate otherwise, you may remove your bandages 24-48 hours after surgery, and you may shower at that time.  You may have steri-strips (small skin tapes) in place directly over the incision.  These strips should be left on the skin for 7-10 days.  If your surgeon used skin glue on the incision, you may shower in 24 hours.  The glue will flake off over the  next 2-3 weeks.  Any sutures or staples will be removed at the office during your follow-up visit. °8. ACTIVITIES:  You may resume regular daily activities (gradually increasing) beginning the next day.  Wearing a good support bra or sports bra minimizes pain and swelling.  You may have sexual intercourse when it is comfortable. °a. You may drive when you no longer are taking prescription pain medication, you can comfortably wear a seatbelt, and you can safely maneuver your car and apply brakes. °b. RETURN TO WORK:  ______________________________________________________________________________________ °9. You should see your doctor in the office for a follow-up appointment approximately two weeks after your surgery.  Your doctor’s nurse will typically make your follow-up appointment when she calls you with your pathology report.  Expect your pathology report 2-3 business days after your surgery.  You may call to check if you do not hear from us after three days. °10. OTHER INSTRUCTIONS: _______________________________________________________________________________________________ _____________________________________________________________________________________________________________________________________ °_____________________________________________________________________________________________________________________________________ °_____________________________________________________________________________________________________________________________________ ° °WHEN TO CALL YOUR DOCTOR: °1. Fever over 101.0 °2. Nausea and/or vomiting. °3. Extreme swelling or bruising. °4. Continued bleeding from incision. °5. Increased pain, redness, or drainage from the incision. ° °The clinic staff is available to answer your questions during regular business hours.  Please don’t hesitate to call and ask to speak to one of the nurses for clinical concerns.  If you have a medical emergency, go to the nearest  emergency room or call 911.  A surgeon from [facility] is always on call at the hospital. ° °For further questions, please visit [email or web address]  °

## 2016-03-08 NOTE — Anesthesia Postprocedure Evaluation (Signed)
Anesthesia Post Note  Patient: Jocelyn Day  Procedure(s) Performed: Procedure(s) (LRB): RIGHT BREAST LUMPECTOMY WITH RADIOACTIVE SEED LOCALIZATION (Right)  Patient location during evaluation: PACU Anesthesia Type: General Level of consciousness: awake and alert Pain management: pain level controlled Vital Signs Assessment: post-procedure vital signs reviewed and stable Respiratory status: spontaneous breathing, nonlabored ventilation, respiratory function stable and patient connected to nasal cannula oxygen Cardiovascular status: blood pressure returned to baseline and stable Postop Assessment: no signs of nausea or vomiting Anesthetic complications: no    Last Vitals:  Filed Vitals:   03/08/16 0904 03/08/16 0915  BP: 117/57 120/60  Pulse: 59 57  Temp: 36.1 C   Resp: 16 18    Last Pain: There were no vitals filed for this visit.               [place]

## 2016-03-08 NOTE — Transfer of Care (Signed)
Immediate Anesthesia Transfer of Care Note  Patient: Jocelyn Day  Procedure(s) Performed: Procedure(s): RIGHT BREAST LUMPECTOMY WITH RADIOACTIVE SEED LOCALIZATION (Right)  Patient Location: PACU  Anesthesia Type:General  Level of Consciousness: awake, alert , oriented, patient cooperative and responds to stimulation  Airway & Oxygen Therapy: Patient Spontanous Breathing and Patient connected to nasal cannula oxygen  Post-op Assessment: Report given to RN, Post -op Vital signs reviewed and stable and Patient moving all extremities X 4  Post vital signs: Reviewed and stable  Last Vitals:  Filed Vitals:   03/08/16 0604  BP: 128/85  Pulse: 57  Temp: 36.4 C  Resp: 18    Last Pain: There were no vitals filed for this visit.       Complications: No apparent anesthesia complications

## 2016-03-09 ENCOUNTER — Other Ambulatory Visit: Payer: Self-pay | Admitting: Cardiovascular Disease

## 2016-03-09 ENCOUNTER — Encounter (HOSPITAL_COMMUNITY): Payer: Self-pay | Admitting: General Surgery

## 2016-03-10 NOTE — Progress Notes (Signed)
Quick Note:  Inform patient of Pathology report,.tell her that her pathology specimen shows ductal carcinoma in situ with negative resection margins. She will not need any further surgery. We sure that she has follow-up appointments with her radiation oncologist and medical oncologist. I will discuss this with her in detail at her next office visit.   hmi ______

## 2016-03-18 ENCOUNTER — Other Ambulatory Visit: Payer: Self-pay | Admitting: Gynecology

## 2016-03-18 NOTE — Telephone Encounter (Signed)
Annual 03/31/16

## 2016-03-21 ENCOUNTER — Ambulatory Visit (HOSPITAL_BASED_OUTPATIENT_CLINIC_OR_DEPARTMENT_OTHER): Payer: Medicare Other | Admitting: Hematology and Oncology

## 2016-03-21 ENCOUNTER — Telehealth: Payer: Self-pay | Admitting: Hematology and Oncology

## 2016-03-21 ENCOUNTER — Encounter: Payer: Self-pay | Admitting: Gynecology

## 2016-03-21 ENCOUNTER — Encounter: Payer: Self-pay | Admitting: Hematology and Oncology

## 2016-03-21 DIAGNOSIS — D0511 Intraductal carcinoma in situ of right breast: Secondary | ICD-10-CM

## 2016-03-21 DIAGNOSIS — Z170 Estrogen receptor positive status [ER+]: Secondary | ICD-10-CM

## 2016-03-21 DIAGNOSIS — C50411 Malignant neoplasm of upper-outer quadrant of right female breast: Secondary | ICD-10-CM

## 2016-03-21 NOTE — Progress Notes (Signed)
Patient Care Team: Marletta Lor, MD as PCP - General (Internal Medicine) [name], MD as Consulting Physician (General Surgery) Nicholas Lose, MD as Consulting Physician (Hematology and Oncology) Gery Pray, MD as Consulting Physician (Radiation Oncology)  DIAGNOSIS: Breast cancer of upper-outer quadrant of right female breast [facility])   Staging form: Breast, AJCC 7th Edition   - Clinical stage from 02/10/2016: Stage 0 (Tis (DCIS), N0, M0) - Unsigned  SUMMARY OF ONCOLOGIC HISTORY:   Breast cancer of upper-outer quadrant of right female breast ([place])   01/27/2016 Initial Diagnosis    Right breast biopsy: DCIS with calcifications lower intermediate grade, ER 100%, PR 80%, Tis N0 stage 0; screening mammogram revealed right breast calcifications 8 mm     03/08/2016 Surgery    Right lumpectomy: DCIS intermediate grade with calcifications, 0.9 cm, margins negative, ER 100%, PR 80%, Tis NX stage 0     CHIEF COMPLIANT: Follow-up of recent lumpectomy  INTERVAL HISTORY: Jocelyn Day is a 75 year old with above-mentioned history of right breast DCIS underwent lumpectomy and is here to discuss pathology report. She is still healing from the surgery. She reports that there is mild to moderate discomfort but she is not taking any pain medication.  REVIEW OF SYSTEMS:   Constitutional: Denies fevers, chills or abnormal weight loss Eyes: Denies blurriness of vision Ears, nose, mouth, throat, and face: Denies mucositis or sore throat Respiratory: Denies cough, dyspnea or wheezes Cardiovascular: Denies palpitation, chest discomfort Gastrointestinal:  Denies nausea, heartburn or change in bowel habits Skin: Denies abnormal skin rashes Lymphatics: Denies new lymphadenopathy or easy bruising Neurological:Denies numbness, tingling or new weaknesses Behavioral/Psych: Mood is stable, no new changes  Extremities: No lower extremity edema Breast: Discomfort from recent lumpectomy. All other  systems were reviewed with the patient and are negative.  I have reviewed the past medical history, past surgical history, social history and family history with the patient and they are unchanged from previous note.  ALLERGIES:  is allergic to no known allergies.  MEDICATIONS:  Current Outpatient Prescriptions  Medication Sig Dispense Refill  . alendronate (FOSAMAX) 70 MG tablet TAKE 1 TABLET(70 MG) BY MOUTH EVERY 7 DAYS WITH A FULL GLASS OF WATER AND ON AN EMPTY STOMACH 4 tablet 0  . aspirin 81 MG tablet Take 81 mg by mouth daily.      [name] atorvastatin (LIPITOR) 20 MG tablet TAKE 1 TABLET BY MOUTH DAILY 90 tablet 2  . Calcium Carb-Cholecalciferol (CALCIUM PLUS VITAMIN D3 PO) Take by mouth 2 (two) times daily.      . carvedilol (COREG) 12.5 MG tablet TAKE 1 TABLET BY MOUTH TWICE DAILY WITH MEALS 60 tablet 11  . diphenhydrAMINE (BENADRYL ALLERGY) 25 mg capsule Take 25 mg by mouth every 6 (six) hours as needed for sleep.     . diphenhydramine-acetaminophen (TYLENOL PM) 25-500 MG TABS tablet Take 1 tablet by mouth at bedtime as needed.    [name] HYDROcodone-acetaminophen (NORCO) 5-325 MG tablet Take 1-2 tablets by mouth every 6 (six) hours as needed for moderate pain or severe pain. 30 tablet 0  . lisinopril (PRINIVIL,ZESTRIL) 5 MG tablet TAKE 1 TABLET BY MOUTH EVERY DAY 90 tablet 3   No current facility-administered medications for this visit.     PHYSICAL EXAMINATION: ECOG PERFORMANCE STATUS: 1 - Symptomatic but completely ambulatory  Vitals:   03/21/16 1029  BP: (!) 141/69  Pulse: 66  Resp: 18  Temp: 98.6 F (37 C)   Filed Weights   03/21/16 1029  Weight:  129 lb 14.4 oz (58.9 kg)    GENERAL:alert, no distress and comfortable SKIN: skin color, texture, turgor are normal, no rashes or significant lesions EYES: normal, Conjunctiva are pink and non-injected, sclera clear OROPHARYNX:no exudate, no erythema and lips, buccal mucosa, and tongue normal  NECK: supple, thyroid normal size,  non-tender, without nodularity LYMPH:  no palpable lymphadenopathy in the cervical, axillary or inguinal LUNGS: clear to auscultation and percussion with normal breathing effort HEART: regular rate & rhythm and no murmurs and no lower extremity edema ABDOMEN:abdomen soft, non-tender and normal bowel sounds MUSCULOSKELETAL:no cyanosis of digits and no clubbing  NEURO: alert & oriented x 3 with fluent speech, no focal motor/sensory deficits EXTREMITIES: No lower extremity edema BREAST:The surgical scar appears to be healing well. There is still glue attached to it. There is no area of mild erythema on the medial aspect of it but there is no evidence of cellulitis or infection.. (exam performed in the presence of a chaperone)  LABORATORY DATA:  I have reviewed the data as listed   Chemistry      Component Value Date/Time   NA 138 03/01/2016 1138   NA 139 02/10/2016 1220   K 4.6 03/01/2016 1138   K 4.9 02/10/2016 1220   CL 105 03/01/2016 1138   CO2 28 03/01/2016 1138   CO2 26 02/10/2016 1220   BUN 12 03/01/2016 1138   BUN 10.8 02/10/2016 1220   CREATININE 0.91 03/01/2016 1138   CREATININE 1.0 02/10/2016 1220      Component Value Date/Time   CALCIUM 9.3 03/01/2016 1138   CALCIUM 9.3 02/10/2016 1220   ALKPHOS 59 03/01/2016 1138   ALKPHOS 61 02/10/2016 1220   AST 30 03/01/2016 1138   AST 23 02/10/2016 1220   ALT 25 03/01/2016 1138   ALT 15 02/10/2016 1220   BILITOT 1.0 03/01/2016 1138   BILITOT 0.62 02/10/2016 1220       Lab Results  Component Value Date   WBC 5.9 03/01/2016   HGB 14.8 03/01/2016   HCT 45.3 03/01/2016   MCV 99.3 03/01/2016   PLT 171 03/01/2016   NEUTROABS 3.7 03/01/2016   ASSESSMENT & PLAN:  Breast cancer of upper-outer quadrant of right female breast ([place]) Right lumpectomy 03/08/2016: DCIS intermediate grade with calcifications, 0.9 cm, margins negative, ER 100%, PR 80%, Tis NX stage 0  Pathology counseling: I discussed the final pathology report of  the patient provided  a copy of this report. I discussed the margins as well as lymph node surgeries. We also discussed the final staging along with previously performed ER/PR testing.  Recommendation: 1. +/- Adjuvant radiation therapy 2. +/-Followed by antiestrogen therapy with tamoxifen 5 years  I discussed the [facility] nomogram for DCIS. Based on her results, if no further therapy is done her 10 year risk of recurrence was at 11%. If only radiation was done the risk of recurrence would decrease to 4%. If only antiestrogen therapy was done the risk of recurrence of be reduced to 6%. If both radiation and antiestrogen therapy were done the risk of recurrence would be reduced to 2%. Based on these numbers, patient is debating the pros and cons of either radiation or the pill. She is probably not going to do both. She is meeting with Dr. [name] Come next week to make up her final decision.  If she decides not to take radiation therapy, then we can call in a prescription for tamoxifen 20 mg daily 5 years. She will call us and inform  us of her decision. Return to clinic in 3 months for follow-up   No orders of the defined types were placed in this encounter.  The patient has a good understanding of the overall plan. she agrees with it. she will call with any problems that may develop before the next visit here.   Rulon Eisenmenger, MD 03/21/16

## 2016-03-21 NOTE — Progress Notes (Signed)
Location of Breast Cancer: ductal carcinoma in situ right breast, upper outer quadrant   Histology per Pathology Report:   03/08/16 Diagnosis Breast, lumpectomy, Right - DUCTAL CARCINOMA IN SITU, INTERMEDIATE GRADE WITH CALCIFICATIONS, SPANNING 0.9 CM. - RESECTION MARGINS ARE NEGATIVE FOR CARCINOMA. - BIOPSY SITE. - SEE ONCOLOGY TABLE.  01/27/16 Diagnosis Breast, right, needle core biopsy - DUCTAL CARCINOMA IN SITU WITH CALCIFICATIONS. - SEE COMMENT  Receptor Status: ER(100%), PR (80%)  Did patient present with symptoms (if so, please note symptoms) or was this found on screening mammography?: screening mammogram  Past/Anticipated interventions by surgeon, if any: 03/08/16 - Procedure: RIGHT BREAST LUMPECTOMY WITH RADIOACTIVE SEED LOCALIZATION;  Surgeon: [name], MD  Past/Anticipated interventions by medical oncology, if any: antiestrogen therapy with tamoxifen 5 years   Lymphedema issues, if any:  no    Pain issues, if any:  no   OB Gyn history: patient was 14 years with first period, she was 31 with birth of first child, she has 2 children.  She has not used birth control pills or hormone replacement.  She does not have a family history of breast cancer.    SAFETY ISSUES:  Prior radiation? no  Pacemaker/ICD? no  Possible current pregnancy?no  Is the patient on methotrexate? no  Current Complaints / other details:    BP 129/64 (BP Location: Left Arm, Patient Position: Sitting)   Pulse 74   Temp 98.2 F (36.8 C) (Oral)   Ht 5' 4.5" (1.638 m)   Wt 131 lb 4.8 oz (59.6 kg)   SpO2 99%   BMI 22.19 kg/m    Wt Readings from Last 3 Encounters:  03/23/16 131 lb 4.8 oz (59.6 kg)  03/21/16 129 lb 14.4 oz (58.9 kg)  03/01/16 129 lb 4.8 oz (58.7 kg)   Hess, Craige Cotta, RN 03/21/2016,8:10 AM

## 2016-03-21 NOTE — Assessment & Plan Note (Signed)
Right lumpectomy 03/08/2016: DCIS intermediate grade with calcifications, 0.9 cm, margins negative, ER 100%, PR 80%, Tis NX stage 0  Pathology counseling: I discussed the final pathology report of the patient provided  a copy of this report. I discussed the margins as well as lymph node surgeries. We also discussed the final staging along with previously performed ER/PR testing.  Recommendation: 1. Adjuvant radiation therapy 2. Followed by antiestrogen therapy with tamoxifen 5 years  Return to clinic in 3 months for follow-up to start antiestrogen therapy with tamoxifen.

## 2016-03-21 NOTE — Telephone Encounter (Signed)
appt made and avs printed °

## 2016-03-23 ENCOUNTER — Ambulatory Visit
Admission: RE | Admit: 2016-03-23 | Discharge: 2016-03-23 | Disposition: A | Discharge status: Home / Self Care | Payer: Medicare Other | Source: Ambulatory Visit | Attending: Radiation Oncology | Admitting: Radiation Oncology

## 2016-03-23 ENCOUNTER — Encounter: Payer: Self-pay | Admitting: Genetic Counselor

## 2016-03-23 VITALS — BP 129/64 | HR 74 | Temp 98.2°F | Ht 64.5 in | Wt 131.3 lb

## 2016-03-23 DIAGNOSIS — C50411 Malignant neoplasm of upper-outer quadrant of right female breast: Secondary | ICD-10-CM

## 2016-03-23 DIAGNOSIS — Z170 Estrogen receptor positive status [ER+]: Secondary | ICD-10-CM | POA: Diagnosis not present

## 2016-03-23 NOTE — Progress Notes (Signed)
Radiation Oncology         [phone] ________________________________  Name: Jocelyn Day MRN: [id number]  Date: 03/23/2016  DOB: 31-Dec-1940  Follow-Up Visit Note  CC: Jocelyn Cowden, MD  Marletta Lor, MD    ICD-9-CM ICD-10-CM   1. Breast cancer of upper-outer quadrant of right female breast ([place]) 174.4 C50.411     Diagnosis:   Clinical Stage 0, DCIS of the right breast.  Narrative:  The patient returns today for follow up after initial consultation in breast clinic 02/10/2016. She had a screening mammogram at [facility] that noted a group of heterogenous calcifications in the upper outer quadrant of the right breast. Diagnostic mammogram confirmed this group of calcifications and biopsy 01/27/2016 revealed low to intermediate grade DCIS with calcifications, ER (100%), PR (80%).  She presents today after her lumpectomy 03/08/2016. Pathology showed the mass was intermediate grade DCIS with negative margins, ER (100%), PR (80%). She is here to discuss radiation treatment options.  She mentions her breast is tender but she is not bothered by it. She denies chills, fever, or swelling in her arm.   ALLERGIES:  is allergic to no known allergies.  Meds: Current Outpatient Prescriptions  Medication Sig Dispense Refill  . alendronate (FOSAMAX) 70 MG tablet TAKE 1 TABLET(70 MG) BY MOUTH EVERY 7 DAYS WITH A FULL GLASS OF WATER AND ON AN EMPTY STOMACH 4 tablet 0  . aspirin 81 MG tablet Take 81 mg by mouth daily.      [name] atorvastatin (LIPITOR) 20 MG tablet TAKE 1 TABLET BY MOUTH DAILY 90 tablet 2  . Calcium Carb-Cholecalciferol (CALCIUM PLUS VITAMIN D3 PO) Take by mouth 2 (two) times daily.      . carvedilol (COREG) 12.5 MG tablet TAKE 1 TABLET BY MOUTH TWICE DAILY WITH MEALS 60 tablet 11  . diphenhydrAMINE (BENADRYL ALLERGY) 25 mg capsule Take 25 mg by mouth every 6 (six) hours as needed for sleep.     [name] lisinopril (PRINIVIL,ZESTRIL) 5 MG tablet TAKE 1 TABLET BY MOUTH  EVERY DAY 90 tablet 3  . diphenhydramine-acetaminophen (TYLENOL PM) 25-500 MG TABS tablet Take 1 tablet by mouth at bedtime as needed.    [name] HYDROcodone-acetaminophen (NORCO) 5-325 MG tablet Take 1-2 tablets by mouth every 6 (six) hours as needed for moderate pain or severe pain. (Patient not taking: Reported on 03/23/2016) 30 tablet 0   No current facility-administered medications for this encounter.     Physical Findings: The patient is in no acute distress. Patient is alert and oriented.  height is 5' 4.5" (1.638 m) and weight is 131 lb 4.8 oz (59.6 kg). Her oral temperature is 98.2 F (36.8 C). Her blood pressure is 129/64 and her pulse is 74. Her oxygen saturation is 99%. . The lungs are clear to auscultation bilaterally. The heart has a regular rhythm and rate. No palpable supraclavicular, cervical, or axillary adenopathy. Well healing lumpectomy scar in the upper outer quadrant of the right breast with surgical glue in place. Healing well with no signs of infection.  Lab Findings: Lab Results  Component Value Date   WBC 5.9 03/01/2016   HGB 14.8 03/01/2016   HCT 45.3 03/01/2016   MCV 99.3 03/01/2016   PLT 171 03/01/2016    Radiographic Findings: No results found.  Impression:  [name] is a 75 yo woman with intermediate grade DCIS of the right female breast. She is a good candidate for breast conservation therapy with radiation therapy or anti-estrogen therapy.  Plan:  We discussed that she is able to receive radiation therapy or hormone therapy, depending on which she prefers. We discussed the role of radiation and its side effects. We discussed that she would need 4 weeks of treatment. She is leaning towards just anti-estrogen therapy at this time. She understands she can call me if she has any questions about radiation or would like to move forward with radiation treatment.   She has a follow up with Dr. Lindi Adie 06/21/2016 and will discuss taking Tamoxifen at that time Or sooner  if she decides to pursue hormonal therapy.  ____________________________________  Blair Promise, PhD, MD    This document serves as a record of services personally performed by Gery Pray, MD. It was created on his behalf by Lendon Collar, a trained medical scribe. The creation of this record is based on the scribe's personal observations and the provider's statements to them. This document has been checked and approved by the attending provider.

## 2016-03-25 ENCOUNTER — Telehealth: Payer: Self-pay | Admitting: Cardiovascular Disease

## 2016-03-25 NOTE — Telephone Encounter (Signed)
Spoke with pt and informed her that Jocelyn Day, [facility] reviewed her medications and states there are no interactions with Tamoxifen and her cardiac meds.  Pt verbalized understanding and was appreciative for return call.

## 2016-03-25 NOTE — Telephone Encounter (Signed)
Will route to Dr. Acie Fredrickson and Pharmacist for review and advisement.

## 2016-03-25 NOTE — Telephone Encounter (Signed)
Jocelyn Day is calling because she had a lumpectomy done and she is getting ready to go on Tamoxifen and wants to know if there is any problem with the Tamoxifen and the beta blocker that she is currently on . Please call   Thanks

## 2016-03-25 NOTE — Telephone Encounter (Signed)
Reviewed pt's medication list.  No drug interactions with tamoxifen and cardiac meds.

## 2016-03-28 ENCOUNTER — Other Ambulatory Visit: Payer: Self-pay | Admitting: *Deleted

## 2016-03-28 ENCOUNTER — Other Ambulatory Visit: Payer: Self-pay

## 2016-03-28 DIAGNOSIS — C50411 Malignant neoplasm of upper-outer quadrant of right female breast: Secondary | ICD-10-CM

## 2016-03-28 MED ORDER — TAMOXIFEN CITRATE 20 MG PO TABS: 20.0000 mg | ORAL_TABLET | Freq: Every day | ORAL | 3 refills | Status: DC

## 2016-03-28 NOTE — Progress Notes (Signed)
Received VM from pt reporting that she wanted to proceed with tamoxifen.  Reviewed this with Dr. Lindi Adie who gave verbal order for 20mg  daily tamoxifen to be called in.  Called to discuss with pt and all questions answered.  Pt to be seen in 3 months for follow up unless pt needs to be seen sooner as discussed with pt. No further questions or concerns at time of call.

## 2016-03-31 ENCOUNTER — Ambulatory Visit (INDEPENDENT_AMBULATORY_CARE_PROVIDER_SITE_OTHER): Payer: Medicare Other | Admitting: Gynecology

## 2016-03-31 ENCOUNTER — Encounter: Payer: Self-pay | Admitting: Gynecology

## 2016-03-31 VITALS — BP 120/74 | Ht 65.0 in | Wt 129.0 lb

## 2016-03-31 DIAGNOSIS — Z01419 Encounter for gynecological examination (general) (routine) without abnormal findings: Secondary | ICD-10-CM

## 2016-03-31 DIAGNOSIS — N952 Postmenopausal atrophic vaginitis: Secondary | ICD-10-CM

## 2016-03-31 DIAGNOSIS — M810 Age-related osteoporosis without current pathological fracture: Secondary | ICD-10-CM

## 2016-03-31 NOTE — Progress Notes (Signed)
    Hibaq Nakao 1941/02/11 [id number]        75 y.o.  [id number]  for breast and pelvic exam. Several issues noted below.  Past medical history,surgical history, problem list, medications, allergies, family history and social history were all reviewed and documented as reviewed in the EPIC chart.  ROS:  Performed with pertinent positives and negatives included in the history, assessment and plan.   Additional significant findings :  None   Exam: [name] assistant Vitals:   03/31/16 1448  BP: 120/74  Weight: 129 lb (58.5 kg)  Height: 5\' 5"  (1.651 m)   Body mass index is 21.47 kg/m.  General appearance:  Normal affect, orientation and appearance. Skin: Grossly normal HEENT: Without gross lesions.  No cervical or supraclavicular adenopathy. Thyroid normal.  Lungs:  Clear without wheezing, rales or rhonchi Cardiac: RR, without RMG Abdominal:  Soft, nontender, without masses, guarding, rebound, organomegaly or hernia Breasts:  Examined lying and sitting. Left without masses, retractions, discharge or axillary adenopathy.  Right with healing recent lumpectomy scar. No gross masses or discharge or adenopathy noted Pelvic:  Ext/BUS/Vagina with atrophic changes  Cervix with atrophic changes. Difficult to visualize  Uterus anteverted, normal size, shape and contour, midline and mobile nontender   Adnexa without masses or tenderness    Anus and perineum normal   Rectovaginal normal sphincter tone without palpated masses or tenderness.    Assessment/Plan:  75 y.o. [id number] female for breast and pelvic exam.   1. Postmenopausal/atrophic genital changes. Without significant hot flushes, night sweats, vaginal dryness or any vaginal bleeding. Continue to monitor report any issues or vaginal bleeding. 2. Osteoporosis.  DEXA 2016 T score -2.6 with statistically significant decline at the spine. Unchanged the hips. Was started on Fosamax last year area does have prior use of Fosamax for  approximately 7 years area recently diagnosed with breast cancer in started on tamoxifen. I reviewed tamoxifen with her in its benefits as far as bone health. At this point we discussed options to include continuing both medications were considering stopping Fosamax. Patient understands that she may have progression of her bone loss by doing so. At this point we both feel comfortable stopping her Fosamax and repeating her bone density next year. 3. Recent diagnosis of breast cancer with surgery 3 weeks ago. Actively being managed by oncology and general surgery. 4. Colonoscopy 2014. Repeat at their recommended interval. 5. Pap smear 2012. No Pap smear done today. No history of significant abnormal Pap smears. We both agree to stop screening based on current screening guidelines and age. 6. Health maintenance. No routine lab work done as patient reports this done elsewhere. Follow up 1 year, sooner as needed  10 minutes of my time in excess of her breast and pelvic exam was spent in direct face to face counseling and coordination of care in regards to her problems of osteoporosis and medication management.    Anastasio Auerbach MD, 3:23 PM 03/31/2016

## 2016-03-31 NOTE — Patient Instructions (Signed)
Stop your Fosamax. We will plan on repeating your bone density next year.

## 2016-04-13 ENCOUNTER — Other Ambulatory Visit: Payer: Self-pay | Admitting: Gynecology

## 2016-04-18 ENCOUNTER — Other Ambulatory Visit: Payer: Self-pay | Admitting: *Deleted

## 2016-04-18 MED ORDER — LISINOPRIL 5 MG PO TABS: 5.0000 mg | ORAL_TABLET | Freq: Every day | ORAL | 2 refills | Status: DC

## 2016-05-06 ENCOUNTER — Telehealth: Payer: Self-pay | Admitting: Hematology and Oncology

## 2016-05-06 NOTE — Telephone Encounter (Signed)
06/21/2016 Appointment rescheduled to 06/22/2016 per patient request.

## 2016-06-01 DIAGNOSIS — H0014 Chalazion left upper eyelid: Secondary | ICD-10-CM | POA: Diagnosis not present

## 2016-06-01 DIAGNOSIS — H2513 Age-related nuclear cataract, bilateral: Secondary | ICD-10-CM | POA: Diagnosis not present

## 2016-06-01 DIAGNOSIS — H5213 Myopia, bilateral: Secondary | ICD-10-CM | POA: Diagnosis not present

## 2016-06-08 ENCOUNTER — Encounter: Payer: Self-pay | Admitting: Internal Medicine

## 2016-06-08 DIAGNOSIS — Z85828 Personal history of other malignant neoplasm of skin: Secondary | ICD-10-CM | POA: Diagnosis not present

## 2016-06-08 DIAGNOSIS — L821 Other seborrheic keratosis: Secondary | ICD-10-CM | POA: Diagnosis not present

## 2016-06-08 DIAGNOSIS — D485 Neoplasm of uncertain behavior of skin: Secondary | ICD-10-CM | POA: Diagnosis not present

## 2016-06-08 DIAGNOSIS — L814 Other melanin hyperpigmentation: Secondary | ICD-10-CM | POA: Diagnosis not present

## 2016-06-08 DIAGNOSIS — C4441 Basal cell carcinoma of skin of scalp and neck: Secondary | ICD-10-CM | POA: Diagnosis not present

## 2016-06-08 DIAGNOSIS — D1801 Hemangioma of skin and subcutaneous tissue: Secondary | ICD-10-CM | POA: Diagnosis not present

## 2016-06-21 ENCOUNTER — Ambulatory Visit: Payer: Medicare Other | Admitting: Hematology and Oncology

## 2016-06-21 NOTE — Assessment & Plan Note (Signed)
Right lumpectomy 03/08/2016: DCIS intermediate grade with calcifications, 0.9 cm, margins negative, ER 100%, PR 80%, Tis NX stage 0 Refused adj XRT  Treatment Plan: Tamoxifen 20 mg daily x 5 years  Tamoxifen Toxicities: We discussed the risks and benefits of tamoxifen. These include but not limited to insomnia, hot flashes, mood changes, vaginal dryness, and weight gain. Although rare, serious side effects including endometrial cancer, risk of blood clots were also discussed. We strongly believe that the benefits far outweigh the risks. Patient understands these risks and consented to starting treatment. Planned treatment duration is 5 years.  RTC in 3 months for tox check

## 2016-06-22 ENCOUNTER — Ambulatory Visit (HOSPITAL_BASED_OUTPATIENT_CLINIC_OR_DEPARTMENT_OTHER): Payer: Medicare Other | Admitting: Hematology and Oncology

## 2016-06-22 ENCOUNTER — Encounter: Payer: Self-pay | Admitting: Hematology and Oncology

## 2016-06-22 DIAGNOSIS — D0501 Lobular carcinoma in situ of right breast: Secondary | ICD-10-CM | POA: Diagnosis not present

## 2016-06-22 DIAGNOSIS — C50411 Malignant neoplasm of upper-outer quadrant of right female breast: Secondary | ICD-10-CM

## 2016-06-22 DIAGNOSIS — Z170 Estrogen receptor positive status [ER+]: Secondary | ICD-10-CM | POA: Diagnosis not present

## 2016-06-22 MED ORDER — TAMOXIFEN CITRATE 20 MG PO TABS: 20.0000 mg | ORAL_TABLET | Freq: Every day | ORAL | 3 refills | Status: DC

## 2016-06-22 NOTE — Progress Notes (Signed)
Patient Care Team: Marletta Lor, MD as PCP - General (Internal Medicine) [name], MD as Consulting Physician (General Surgery) Nicholas Lose, MD as Consulting Physician (Hematology and Oncology) Gery Pray, MD as Consulting Physician (Radiation Oncology)  DIAGNOSIS:  Encounter Diagnosis  Name Primary?  . Malignant neoplasm of upper-outer quadrant of right breast in female, estrogen receptor positive ([place])     SUMMARY OF ONCOLOGIC HISTORY:   Breast cancer of upper-outer quadrant of right female breast ([place])   01/27/2016 Initial Diagnosis    Right breast biopsy: DCIS with calcifications lower intermediate grade, ER 100%, PR 80%, Tis N0 stage 0; screening mammogram revealed right breast calcifications 8 mm      03/08/2016 Surgery    Right lumpectomy: DCIS intermediate grade with calcifications, 0.9 cm, margins negative, ER 100%, PR 80%, Tis NX stage 0       Radiation Therapy    Refused XRT       CHIEF COMPLIANT: Surveillance of breast cancer  INTERVAL HISTORY: Jocelyn Day is a 75 year old with above-mentioned history right breast DCIS underwent lumpectomy and is currently on surveillance. She did not want to take antiestrogen therapy. She denies any lumps or nodules in breast. She is overall feeling quite well.  REVIEW OF SYSTEMS:   Constitutional: Denies fevers, chills or abnormal weight loss Eyes: Denies blurriness of vision Ears, nose, mouth, throat, and face: Denies mucositis or sore throat Respiratory: Denies cough, dyspnea or wheezes Cardiovascular: Denies palpitation, chest discomfort Gastrointestinal:  Denies nausea, heartburn or change in bowel habits Skin: Denies abnormal skin rashes Lymphatics: Denies new lymphadenopathy or easy bruising Neurological:Denies numbness, tingling or new weaknesses Behavioral/Psych: Mood is stable, no new changes  Extremities: No lower extremity edema Breast:  denies any pain or lumps or nodules in either breasts All  other systems were reviewed with the patient and are negative.  I have reviewed the past medical history, past surgical history, social history and family history with the patient and they are unchanged from previous note.  ALLERGIES:  is allergic to no known allergies.  MEDICATIONS:  Current Outpatient Prescriptions  Medication Sig Dispense Refill  . alendronate (FOSAMAX) 70 MG tablet TAKE 1 TABLET(70 MG) BY MOUTH EVERY 7 DAYS WITH A FULL GLASS OF WATER AND ON AN EMPTY STOMACH 4 tablet 0  . aspirin 81 MG tablet Take 81 mg by mouth daily.      [name] atorvastatin (LIPITOR) 20 MG tablet TAKE 1 TABLET BY MOUTH DAILY 90 tablet 2  . Calcium Carb-Cholecalciferol (CALCIUM PLUS VITAMIN D3 PO) Take by mouth 2 (two) times daily.      . carvedilol (COREG) 12.5 MG tablet TAKE 1 TABLET BY MOUTH TWICE DAILY WITH MEALS 60 tablet 11  . diphenhydrAMINE (BENADRYL ALLERGY) 25 mg capsule Take 25 mg by mouth every 6 (six) hours as needed for sleep.     [name] lisinopril (PRINIVIL,ZESTRIL) 5 MG tablet Take 1 tablet (5 mg total) by mouth daily. 90 tablet 2  . tamoxifen (NOLVADEX) 20 MG tablet Take 1 tablet (20 mg total) by mouth daily. 90 tablet 3   No current facility-administered medications for this visit.     PHYSICAL EXAMINATION: ECOG PERFORMANCE STATUS: 0 - Asymptomatic  Vitals:   06/22/16 1111  BP: 118/66  Pulse: 70  Resp: 18  Temp: 97.9 F (36.6 C)   Filed Weights   06/22/16 1111  Weight: 132 lb 6.4 oz (60.1 kg)    GENERAL:alert, no distress and comfortable SKIN: skin color, texture, turgor are  normal, no rashes or significant lesions EYES: normal, Conjunctiva are pink and non-injected, sclera clear OROPHARYNX:no exudate, no erythema and lips, buccal mucosa, and tongue normal  NECK: supple, thyroid normal size, non-tender, without nodularity LYMPH:  no palpable lymphadenopathy in the cervical, axillary or inguinal LUNGS: clear to auscultation and percussion with normal breathing effort HEART:  regular rate & rhythm and no murmurs and no lower extremity edema ABDOMEN:abdomen soft, non-tender and normal bowel sounds MUSCULOSKELETAL:no cyanosis of digits and no clubbing  NEURO: alert & oriented x 3 with fluent speech, no focal motor/sensory deficits EXTREMITIES: No lower extremity edema BREAST: No palpable masses or nodules in either right or left breasts. No palpable axillary supraclavicular or infraclavicular adenopathy no breast tenderness or nipple discharge. (exam performed in the presence of a chaperone)  LABORATORY DATA:  I have reviewed the data as listed   Chemistry      Component Value Date/Time   NA 138 03/01/2016 1138   NA 139 02/10/2016 1220   K 4.6 03/01/2016 1138   K 4.9 02/10/2016 1220   CL 105 03/01/2016 1138   CO2 28 03/01/2016 1138   CO2 26 02/10/2016 1220   BUN 12 03/01/2016 1138   BUN 10.8 02/10/2016 1220   CREATININE 0.91 03/01/2016 1138   CREATININE 1.0 02/10/2016 1220      Component Value Date/Time   CALCIUM 9.3 03/01/2016 1138   CALCIUM 9.3 02/10/2016 1220   ALKPHOS 59 03/01/2016 1138   ALKPHOS 61 02/10/2016 1220   AST 30 03/01/2016 1138   AST 23 02/10/2016 1220   ALT 25 03/01/2016 1138   ALT 15 02/10/2016 1220   BILITOT 1.0 03/01/2016 1138   BILITOT 0.62 02/10/2016 1220       Lab Results  Component Value Date   WBC 5.9 03/01/2016   HGB 14.8 03/01/2016   HCT 45.3 03/01/2016   MCV 99.3 03/01/2016   PLT 171 03/01/2016   NEUTROABS 3.7 03/01/2016     ASSESSMENT & PLAN:  Breast cancer of upper-outer quadrant of right female breast ([place]) Right lumpectomy 03/08/2016: DCIS intermediate grade with calcifications, 0.9 cm, margins negative, ER 100%, PR 80%, Tis NX stage 0 Refused adj XRT  Treatment Plan: Tamoxifen 20 mg daily x 5 years  Tamoxifen Toxicities: We discussed the risks and benefits of tamoxifen. These include but not limited to insomnia, hot flashes, mood changes, vaginal dryness, and weight gain. Although rare, serious side  effects including endometrial cancer, risk of blood clots were also discussed. We strongly believe that the benefits far outweigh the risks. Patient understands these risks and consented to starting treatment. Planned treatment duration is 5 years.  RTC in 3 months for tox check   No orders of the defined types were placed in this encounter.  The patient has a good understanding of the overall plan. she agrees with it. she will call with any problems that may develop before the next visit here.   Rulon Eisenmenger, MD 06/22/16

## 2016-06-24 DIAGNOSIS — C4441 Basal cell carcinoma of skin of scalp and neck: Secondary | ICD-10-CM | POA: Diagnosis not present

## 2016-06-29 ENCOUNTER — Other Ambulatory Visit: Payer: Self-pay | Admitting: Cardiovascular Disease

## 2016-06-29 MED ORDER — LISINOPRIL 5 MG PO TABS: 5.0000 mg | ORAL_TABLET | Freq: Every day | ORAL | 2 refills | Status: DC

## 2016-06-29 MED ORDER — CARVEDILOL 12.5 MG PO TABS: 12.5000 mg | ORAL_TABLET | Freq: Two times a day (BID) | ORAL | 2 refills | Status: DC

## 2016-07-02 DIAGNOSIS — H7291 Unspecified perforation of tympanic membrane, right ear: Secondary | ICD-10-CM | POA: Diagnosis not present

## 2016-07-02 DIAGNOSIS — H6501 Acute serous otitis media, right ear: Secondary | ICD-10-CM | POA: Diagnosis not present

## 2016-07-06 ENCOUNTER — Telehealth: Payer: Self-pay | Admitting: Internal Medicine

## 2016-07-06 MED ORDER — ATORVASTATIN CALCIUM 20 MG PO TABS: 20.0000 mg | ORAL_TABLET | Freq: Every day | ORAL | 1 refills | Status: DC

## 2016-07-06 NOTE — Telephone Encounter (Signed)
Pt need new Rx for atorvastatin  Pharm:  [facility]

## 2016-07-06 NOTE — Telephone Encounter (Signed)
Pt notified Rx sent to [facility] as requested. Pt verbalized understanding.

## 2016-07-12 ENCOUNTER — Encounter: Payer: Self-pay | Admitting: Adult Health

## 2016-07-12 ENCOUNTER — Ambulatory Visit (INDEPENDENT_AMBULATORY_CARE_PROVIDER_SITE_OTHER): Payer: Medicare Other | Admitting: Adult Health

## 2016-07-12 ENCOUNTER — Other Ambulatory Visit (INDEPENDENT_AMBULATORY_CARE_PROVIDER_SITE_OTHER): Payer: Medicare Other

## 2016-07-12 VITALS — BP 110/60 | Ht 65.0 in | Wt 128.7 lb

## 2016-07-12 DIAGNOSIS — H7292 Unspecified perforation of tympanic membrane, left ear: Secondary | ICD-10-CM

## 2016-07-12 DIAGNOSIS — Z0000 Encounter for general adult medical examination without abnormal findings: Secondary | ICD-10-CM

## 2016-07-12 LAB — BASIC METABOLIC PANEL
BUN: 13 mg/dL (ref 6–23)
CHLORIDE: 103 meq/L (ref 96–112)
CO2: 30 meq/L (ref 19–32)
CREATININE: 0.94 mg/dL (ref 0.40–1.20)
Calcium: 9.5 mg/dL (ref 8.4–10.5)
GFR: 61.84 mL/min (ref >60.00)
GLUCOSE: 89 mg/dL (ref 70–99)
Potassium: 4.6 mEq/L (ref 3.5–5.1)
Sodium: 141 mEq/L (ref 135–145)

## 2016-07-12 LAB — POC URINALSYSI DIPSTICK (AUTOMATED)
BILIRUBIN UA: NEGATIVE
Glucose, UA: NEGATIVE
Ketones, UA: NEGATIVE
NITRITE UA: NEGATIVE
PH UA: 6
Spec Grav, UA: 1.02
Urobilinogen, UA: 0.2

## 2016-07-12 LAB — CBC WITH DIFFERENTIAL/PLATELET
BASOS PCT: 0.5 % (ref 0.0–3.0)
Basophils Absolute: 0 10*3/uL (ref 0.0–0.1)
EOS ABS: 0.1 10*3/uL (ref 0.0–0.7)
Eosinophils Relative: 0.9 % (ref 0.0–5.0)
HCT: 42.4 % (ref 36.0–46.0)
Hemoglobin: 14.4 g/dL (ref 12.0–15.0)
LYMPHS ABS: 1.9 10*3/uL (ref 0.7–4.0)
Lymphocytes Relative: 26.7 % (ref 12.0–46.0)
MCHC: 34 g/dL (ref 30.0–36.0)
MCV: 96 fl (ref 78.0–100.0)
MONO ABS: 0.3 10*3/uL (ref 0.1–1.0)
Monocytes Relative: 4.7 % (ref 3.0–12.0)
NEUTROS ABS: 4.7 10*3/uL (ref 1.4–7.7)
Neutrophils Relative %: 67.2 % (ref 43.0–77.0)
PLATELETS: 254 10*3/uL (ref 150.0–400.0)
RBC: 4.41 Mil/uL (ref 3.87–5.11)
RDW: 12 % (ref 11.5–15.5)
WBC: 7 10*3/uL (ref 4.0–10.5)

## 2016-07-12 LAB — HEPATIC FUNCTION PANEL
ALT: 15 U/L (ref 0–35)
AST: 19 U/L (ref 0–37)
Albumin: 4.1 g/dL (ref 3.5–5.2)
Alkaline Phosphatase: 38 U/L — ABNORMAL LOW (ref 39–117)
BILIRUBIN DIRECT: 0.1 mg/dL (ref 0.0–0.3)
BILIRUBIN TOTAL: 0.5 mg/dL (ref 0.2–1.2)
Total Protein: 6.7 g/dL (ref 6.0–8.3)

## 2016-07-12 LAB — LIPID PANEL
CHOL/HDL RATIO: 3
Cholesterol: 137 mg/dL (ref 0–200)
HDL: 51 mg/dL (ref >39.00)
LDL CALC: 58 mg/dL (ref 0–99)
NonHDL: 86.18
TRIGLYCERIDES: 139 mg/dL (ref 0.0–149.0)
VLDL: 27.8 mg/dL (ref 0.0–40.0)

## 2016-07-12 MED ORDER — CANAGLIFLOZIN 100 MG PO TABS: 100.0000 mg | ORAL_TABLET | Freq: Every day | ORAL | 2 refills | Status: DC

## 2016-07-12 NOTE — Progress Notes (Signed)
   Subjective:    Patient ID: Jocelyn Day, female    DOB: 03-11-1941, 75 y.o.   MRN: [id number]  HPI  75 year old female who presents to the office today for follow up after " ruptured ear". She reports that she was seen at urgent care a week ago for an ear infection when her ear ruptured. She was started on 7 days of Amoxicllin. Today she would like to make sure that her ear is healing well.   She reports feeling better but that she continues to have some muffled sound  Denies any drainage or fevers  Review of Systems  Constitutional: Negative.   HENT: Positive for hearing loss.   Respiratory: Negative.   Cardiovascular: Negative.   Neurological: Negative.   All other systems reviewed and are negative.      Objective:   Physical Exam  Constitutional: She appears well-developed and well-nourished.  HENT:  Head: Normocephalic and atraumatic.  Right Ear: External ear normal.  Left Ear: External ear normal.  Nose: Nose normal.  Mouth/Throat: Oropharynx is clear and moist. No oropharyngeal exudate.  Bloody scab on left TM. No signs of infection   Skin: She is not diaphoretic.  Vitals reviewed.     Assessment & Plan:  1. Routine general medical examination at a health care facility - pre physical labs  - Basic metabolic panel - CBC with Differential/Platelet - Hepatic function panel - Lipid panel - TSH - POCT Urinalysis Dipstick (Automated)  2. Ruptured tympanic membrane, left - Appears to be healing well.  - Follow up as needed  Dorothyann Peng, NP

## 2016-07-13 LAB — TSH: TSH: 2.84 u[IU]/mL (ref 0.35–4.50)

## 2016-07-19 ENCOUNTER — Ambulatory Visit (INDEPENDENT_AMBULATORY_CARE_PROVIDER_SITE_OTHER): Payer: Medicare Other | Admitting: Internal Medicine

## 2016-07-19 ENCOUNTER — Encounter: Payer: Self-pay | Admitting: Internal Medicine

## 2016-07-19 VITALS — BP 136/72 | HR 84 | Temp 98.4°F | Resp 18 | Ht 64.25 in | Wt 131.0 lb

## 2016-07-19 DIAGNOSIS — Z0000 Encounter for general adult medical examination without abnormal findings: Secondary | ICD-10-CM

## 2016-07-19 NOTE — Progress Notes (Signed)
Subjective:    Patient ID: Jocelyn Day, female    DOB: 01-Mar-1941, 75 y.o.   MRN: [id number]  HPI  75 year old patient who is seen today for a preventive health examination.  The patient has a recent diagnosis of DCIS of the right breast which has been treated by lumpectomy.  She has been seen by general surgery as well as oncology and doing quite well.  She now is on tamoxifen  She has a prior history of Takosubo cardiomyopathy and most recent ejection fraction has normalized.  She is followed by cardiology.  Remains on core egg and lisinopril and does have a history of hypertension.  She remains on statin therapy and daily aspirin She has a history of osteoporosis and remains on Fosamax.  Colonoscopy 2014  Past Medical History:  Diagnosis Date  . ACS (acute coronary syndrome) ([place]) 10/20/2010   Normal coronaries per cath 2/12  . Atrophic vaginitis   . Breast cancer of upper-outer quadrant of right female breast ([place]) 01/28/2016  . Cancer ([place])    skin cancer-basal cell  . History of kidney stones   . Hypertension   . Insomnia   . Myocardial infarction    taksubo syndrome  . Osteoporosis 01/2015   T score -2.5  . Takotsubo cardiomyopathy 10/21/2010   Acute coronary syndrome/Takotsubo cardiomyopathy     Social History   Social History  . Marital status: Married    Spouse name: N/A  . Number of children: 2  . Years of education: N/A   Occupational History  . Not on file.   Social History Main Topics  . Smoking status: Never Smoker  . Smokeless tobacco: Never Used  . Alcohol use 0.0 oz/week     Comment: once or twice  a month a glasses of wine  . Drug use: No  . Sexual activity: No     Comment: 1st intercourse 82 yo-1 partner   Other Topics Concern  . Not on file   Social History Narrative  . No narrative on file    Past Surgical History:  Procedure Laterality Date  . basal cell carinoma excised  2005   face ,breast rt squamous cell 2016  . BREAST BIOPSY   01/27/2016   Malignant  . BREAST LUMPECTOMY WITH RADIOACTIVE SEED LOCALIZATION Right 03/08/2016   Procedure: RIGHT BREAST LUMPECTOMY WITH RADIOACTIVE SEED LOCALIZATION;  Surgeon: [name], MD;  Location: [facility];  Service: General;  Laterality: Right;  . CARDIAC CATHETERIZATION  10/21/2010   Normal coronary arteries -- Apical ballooning syndrome consistent with tako-tsubo cardiomyopathy with an ejection fraction of 30%  . CESAREAN SECTION  1974  . [place]  . CHOLECYSTECTOMY  1993  . COLONOSCOPY    . DILATION AND CURETTAGE OF UTERUS  1990  . [place]  2010  . POLYPECTOMY      Family History  Problem Relation Age of Onset  . Hypertension Mother   . Cancer Father     prostate  . Diabetes Brother     liver translplant and medication induced the diabetes  . Diabetes Paternal Uncle   . Diabetes Cousin   . Cervical cancer Sister   . Heart disease Neg Hx   . Colon cancer Neg Hx     Allergies  Allergen Reactions  . No Known Allergies     Current Outpatient Prescriptions on File Prior to Visit  Medication Sig Dispense Refill  . aspirin 81 MG tablet Take 81 mg by mouth  daily.      . atorvastatin (LIPITOR) 20 MG tablet Take 1 tablet (20 mg total) by mouth daily. 90 tablet 1  . Calcium Carb-Cholecalciferol (CALCIUM PLUS VITAMIN D3 PO) Take by mouth 2 (two) times daily.      . carvedilol (COREG) 12.5 MG tablet Take 1 tablet (12.5 mg total) by mouth 2 (two) times daily with a meal. 180 tablet 2  . diphenhydrAMINE (BENADRYL ALLERGY) 25 mg capsule Take 25 mg by mouth every 6 (six) hours as needed for sleep.     [name] lisinopril (PRINIVIL,ZESTRIL) 5 MG tablet Take 1 tablet (5 mg total) by mouth daily. 90 tablet 2  . tamoxifen (NOLVADEX) 20 MG tablet Take 1 tablet (20 mg total) by mouth daily. 90 tablet 3   No current facility-administered medications on file prior to visit.     BP 136/72 (BP Location: Left Arm, Patient Position: Sitting, Cuff Size: Normal)    Pulse 84   Temp 98.4 F (36.9 C) (Oral)   Resp 18   Ht 5' 4.25" (1.632 m)   Wt 131 lb (59.4 kg)   SpO2 97%   BMI 22.31 kg/m   Medicare wellness  1. Risk factors, based on past  M,S,F history.  Cardiovascular risk factors include hypertension and dyslipidemia.  Patient has had a prior heart catheterization with normal coronary arteries  2.  Physical activities:remains active without restrictions.  Participates in gym activities 3-4 times per week  3.  Depression/mood: no history of major depression or mood disorder.  Has had some situational depression following the death of her husband  4.  Hearing:no deficits  5.  ADL's:independent  6.  Fall risk:low  7.  Home safety:no problems identified  8.  Height weight, and visual acuity;height and weight stable no change in visual acuity  9.  Counseling:continue active lifestyle and heart healthy diet  10. Lab orders based on risk factors:laboratory studies reviewed  11. Referral :not appropriate at this time  12. Care plan:continue efforts at risk factor modification  13. Cognitive assessment: alert and with normal affect no cognitive dysfunction 14. Screening: Patient provided with a written and personalized 5-10 year screening schedule in the AVS.    15. Provider List Update: cardiology, primary care ophthalmology, dermatology OB/GYN and oncology    Review of Systems  Constitutional: Negative for appetite change, fatigue, fever and unexpected weight change.  HENT: Negative for congestion, dental problem, ear pain, hearing loss, mouth sores, nosebleeds, sinus pressure, sore throat, tinnitus, trouble swallowing and voice change.        History of recent perforation of the left TM  Eyes: Negative for photophobia, pain, redness and visual disturbance.  Respiratory: Negative for cough, chest tightness and shortness of breath.   Cardiovascular: Negative for chest pain, palpitations and leg swelling.  Gastrointestinal: Negative  for abdominal distention, abdominal pain, blood in stool, constipation, diarrhea, nausea, rectal pain and vomiting.  Genitourinary: Negative for difficulty urinating, dysuria, flank pain, frequency, genital sores, hematuria, menstrual problem, pelvic pain, urgency, vaginal bleeding, vaginal discharge and vaginal pain.  Musculoskeletal: Negative for arthralgias, back pain and neck stiffness.  Skin: Negative for rash.  Neurological: Negative for dizziness, syncope, speech difficulty, weakness, light-headedness, numbness and headaches.  Hematological: Negative for adenopathy. Does not bruise/bleed easily.  Psychiatric/Behavioral: Negative for agitation, behavioral problems, dysphoric mood, self-injury and suicidal ideas. The patient is not nervous/anxious.        Objective:   Physical Exam  Constitutional: She is oriented to person, place, and time.  She appears well-developed and well-nourished.  Blood pressure left arm 124 over 70  HENT:  Head: Normocephalic and atraumatic.  Right Ear: External ear normal.  Left Ear: External ear normal.  Mouth/Throat: Oropharynx is clear and moist.  Small clot over the left TM  Eyes: Conjunctivae and EOM are normal.  Neck: Normal range of motion. Neck supple. No JVD present. No thyromegaly present.  Cardiovascular: Normal rate, regular rhythm, normal heart sounds and intact distal pulses.   No murmur heard. Pulmonary/Chest: Effort normal and breath sounds normal. She has no wheezes. She has no rales.  Abdominal: Soft. Bowel sounds are normal. She exhibits no distension and no mass. There is no tenderness. There is no rebound and no guarding.  Musculoskeletal: Normal range of motion. She exhibits no edema or tenderness.  Neurological: She is alert and oriented to person, place, and time. She has normal reflexes. No cranial nerve deficit. She exhibits normal muscle tone. Coordination normal.  Skin: Skin is warm and dry. No rash noted.  Psychiatric: She has  a normal mood and affect. Her behavior is normal.          Assessment & Plan:   Preventive health examination Medicare wellness Essential hypertension, well-controlled Dyslipidemia.  Continue statin therapy History of right breast DCIS status post lumpectomy.  Follow-up oncology.  Continue tamoxifen Osteoporosis.  Continue vitamin D and calcium supplementation as well as Fosamax  Follow-up one year or as needed  Nyoka Cowden

## 2016-07-19 NOTE — Progress Notes (Signed)
Pre visit review using our clinic review tool, if applicable. No additional management support is needed unless otherwise documented below in the visit note. 

## 2016-07-19 NOTE — Patient Instructions (Addendum)
Limit your sodium (Salt) intake  Please check your blood pressure on a regular basis.  If it is consistently greater than 150/90, please make an office appointment.    It is important that you exercise regularly, at least 20 minutes 3 to 4 times per week.  If you develop chest pain or shortness of breath seek  medical attention.  Take a calcium supplement, plus [phone] units of vitamin D   Health Maintenance for Postmenopausal Women Introduction Menopause is a normal process in which your reproductive ability comes to an end. This process happens gradually over a span of months to years, usually between the ages of 42 and 108. Menopause is complete when you have missed 12 consecutive menstrual periods. It is important to talk with your health care provider about some of the most common conditions that affect postmenopausal women, such as heart disease, cancer, and bone loss (osteoporosis). Adopting a healthy lifestyle and getting preventive care can help to promote your health and wellness. Those actions can also lower your chances of developing some of these common conditions. What should I know about menopause? During menopause, you may experience a number of symptoms, such as:  Moderate-to-severe hot flashes.  Night sweats.  Decrease in sex drive.  Mood swings.  Headaches.  Tiredness.  Irritability.  Memory problems.  Insomnia. Choosing to treat or not to treat menopausal changes is an individual decision that you make with your health care provider. What should I know about hormone replacement therapy and supplements? Hormone therapy products are effective for treating symptoms that are associated with menopause, such as hot flashes and night sweats. Hormone replacement carries certain risks, especially as you become older. If you are thinking about using estrogen or estrogen with progestin treatments, discuss the benefits and risks with your health care provider. What should  I know about heart disease and stroke? Heart disease, heart attack, and stroke become more likely as you age. This may be due, in part, to the hormonal changes that your body experiences during menopause. These can affect how your body processes dietary fats, triglycerides, and cholesterol. Heart attack and stroke are both medical emergencies. There are many things that you can do to help prevent heart disease and stroke:  Have your blood pressure checked at least every 1-2 years. High blood pressure causes heart disease and increases the risk of stroke.  If you are 61-65 years old, ask your health care provider if you should take aspirin to prevent a heart attack or a stroke.  Do not use any tobacco products, including cigarettes, chewing tobacco, or electronic cigarettes. If you need help quitting, ask your health care provider.  It is important to eat a healthy diet and maintain a healthy weight.  Be sure to include plenty of vegetables, fruits, low-fat dairy products, and lean protein.  Avoid eating foods that are high in solid fats, added sugars, or salt (sodium).  Get regular exercise. This is one of the most important things that you can do for your health.  Try to exercise for at least 150 minutes each week. The type of exercise that you do should increase your heart rate and make you sweat. This is known as moderate-intensity exercise.  Try to do strengthening exercises at least twice each week. Do these in addition to the moderate-intensity exercise.  Know your numbers.Ask your health care provider to check your cholesterol and your blood glucose. Continue to have your blood tested as directed by your health care provider.  What should I know about cancer screening? There are several types of cancer. Take the following steps to reduce your risk and to catch any cancer development as early as possible. Breast Cancer  Practice breast self-awareness.  This means understanding how  your breasts normally appear and feel.  It also means doing regular breast self-exams. Let your health care provider know about any changes, no matter how small.  If you are 50 or older, have a clinician do a breast exam (clinical breast exam or CBE) every year. Depending on your age, family history, and medical history, it may be recommended that you also have a yearly breast X-ray (mammogram).  If you have a family history of breast cancer, talk with your health care provider about genetic screening.  If you are at high risk for breast cancer, talk with your health care provider about having an MRI and a mammogram every year.  Breast cancer (BRCA) gene test is recommended for women who have family members with BRCA-related cancers. Results of the assessment will determine the need for genetic counseling and BRCA1 and for BRCA2 testing. BRCA-related cancers include these types:  Breast. This occurs in males or females.  Ovarian.  Tubal. This may also be called fallopian tube cancer.  Cancer of the abdominal or pelvic lining (peritoneal cancer).  Prostate.  Pancreatic. Cervical, Uterine, and Ovarian Cancer  Your health care provider may recommend that you be screened regularly for cancer of the pelvic organs. These include your ovaries, uterus, and vagina. This screening involves a pelvic exam, which includes checking for microscopic changes to the surface of your cervix (Pap test).  For women ages 21-65, health care providers may recommend a pelvic exam and a Pap test every three years. For women ages 19-65, they may recommend the Pap test and pelvic exam, combined with testing for human papilloma virus (HPV), every five years. Some types of HPV increase your risk of cervical cancer. Testing for HPV may also be done on women of any age who have unclear Pap test results.  Other health care providers may not recommend any screening for nonpregnant women who are considered low risk for  pelvic cancer and have no symptoms. Ask your health care provider if a screening pelvic exam is right for you.  If you have had past treatment for cervical cancer or a condition that could lead to cancer, you need Pap tests and screening for cancer for at least 20 years after your treatment. If Pap tests have been discontinued for you, your risk factors (such as having a new sexual partner) need to be reassessed to determine if you should start having screenings again. Some women have medical problems that increase the chance of getting cervical cancer. In these cases, your health care provider may recommend that you have screening and Pap tests more often.  If you have a family history of uterine cancer or ovarian cancer, talk with your health care provider about genetic screening.  If you have vaginal bleeding after reaching menopause, tell your health care provider.  There are currently no reliable tests available to screen for ovarian cancer. Lung Cancer  Lung cancer screening is recommended for adults 7-32 years old who are at high risk for lung cancer because of a history of smoking. A yearly low-dose CT scan of the lungs is recommended if you:  Currently smoke.  Have a history of at least 30 pack-years of smoking and you currently smoke or have quit within the past  15 years. A pack-year is smoking an average of one pack of cigarettes per day for one year. Yearly screening should:  Continue until it has been 15 years since you quit.  Stop if you develop a health problem that would prevent you from having lung cancer treatment. Colorectal Cancer  This type of cancer can be detected and can often be prevented.  Routine colorectal cancer screening usually begins at age 67 and continues through age 71.  If you have risk factors for colon cancer, your health care provider may recommend that you be screened at an earlier age.  If you have a family history of colorectal cancer, talk with  your health care provider about genetic screening.  Your health care provider may also recommend using home test kits to check for hidden blood in your stool.  A small camera at the end of a tube can be used to examine your colon directly (sigmoidoscopy or colonoscopy). This is done to check for the earliest forms of colorectal cancer.  Direct examination of the colon should be repeated every 5-10 years until age 71. However, if early forms of precancerous polyps or small growths are found or if you have a family history or genetic risk for colorectal cancer, you may need to be screened more often. Skin Cancer  Check your skin from head to toe regularly.  Monitor any moles. Be sure to tell your health care provider:  About any new moles or changes in moles, especially if there is a change in a mole's shape or color.  If you have a mole that is larger than the size of a pencil eraser.  If any of your family members has a history of skin cancer, especially at a young age, talk with your health care provider about genetic screening.  Always use sunscreen. Apply sunscreen liberally and repeatedly throughout the day.  Whenever you are outside, protect yourself by wearing long sleeves, pants, a wide-brimmed hat, and sunglasses. What should I know about osteoporosis? Osteoporosis is a condition in which bone destruction happens more quickly than new bone creation. After menopause, you may be at an increased risk for osteoporosis. To help prevent osteoporosis or the bone fractures that can happen because of osteoporosis, the following is recommended:  If you are 72-58 years old, get at least 1,000 mg of calcium and at least 600 mg of vitamin D per day.  If you are older than age 14 but younger than age 6, get at least 1,200 mg of calcium and at least 600 mg of vitamin D per day.  If you are older than age 93, get at least 1,200 mg of calcium and at least 800 mg of vitamin D per day. Smoking  and excessive alcohol intake increase the risk of osteoporosis. Eat foods that are rich in calcium and vitamin D, and do weight-bearing exercises several times each week as directed by your health care provider. What should I know about how menopause affects my mental health? Depression may occur at any age, but it is more common as you become older. Common symptoms of depression include:  Low or sad mood.  Changes in sleep patterns.  Changes in appetite or eating patterns.  Feeling an overall lack of motivation or enjoyment of activities that you previously enjoyed.  Frequent crying spells. Talk with your health care provider if you think that you are experiencing depression. What should I know about immunizations? It is important that you get and maintain your  immunizations. These include:  Tetanus, diphtheria, and pertussis (Tdap) booster vaccine.  Influenza every year before the flu season begins.  Pneumonia vaccine.  Shingles vaccine. Your health care provider may also recommend other immunizations. This information is not intended to replace advice given to you by your health care provider. Make sure you discuss any questions you have with your health care provider. Document Released: 09/30/2005 Document Revised: 02/26/2016 Document Reviewed: 05/12/2015  2017 [employer]

## 2016-09-25 ENCOUNTER — Other Ambulatory Visit: Payer: Self-pay | Admitting: Family Medicine

## 2016-10-26 DIAGNOSIS — L821 Other seborrheic keratosis: Secondary | ICD-10-CM | POA: Diagnosis not present

## 2016-10-26 DIAGNOSIS — Z85828 Personal history of other malignant neoplasm of skin: Secondary | ICD-10-CM | POA: Diagnosis not present

## 2016-11-16 DIAGNOSIS — L821 Other seborrheic keratosis: Secondary | ICD-10-CM | POA: Diagnosis not present

## 2016-11-16 DIAGNOSIS — Z85828 Personal history of other malignant neoplasm of skin: Secondary | ICD-10-CM | POA: Diagnosis not present

## 2016-12-12 ENCOUNTER — Other Ambulatory Visit: Payer: Self-pay | Admitting: Cardiovascular Disease

## 2016-12-12 ENCOUNTER — Other Ambulatory Visit: Payer: Self-pay | Admitting: Internal Medicine

## 2016-12-12 NOTE — Telephone Encounter (Signed)
Sent to [facility] for 6 months.  Last filled at local pharmacy.  Last CPX 06/2016 along with last lipid panel.

## 2017-01-03 ENCOUNTER — Encounter: Payer: Self-pay | Admitting: Hematology and Oncology

## 2017-01-03 ENCOUNTER — Ambulatory Visit (HOSPITAL_BASED_OUTPATIENT_CLINIC_OR_DEPARTMENT_OTHER): Payer: Medicare Other | Admitting: Hematology and Oncology

## 2017-01-03 DIAGNOSIS — Z170 Estrogen receptor positive status [ER+]: Secondary | ICD-10-CM

## 2017-01-03 DIAGNOSIS — Z79811 Long term (current) use of aromatase inhibitors: Secondary | ICD-10-CM | POA: Diagnosis not present

## 2017-01-03 DIAGNOSIS — C50411 Malignant neoplasm of upper-outer quadrant of right female breast: Secondary | ICD-10-CM

## 2017-01-03 DIAGNOSIS — D0511 Intraductal carcinoma in situ of right breast: Secondary | ICD-10-CM

## 2017-01-03 NOTE — Progress Notes (Signed)
Patient Care Team: Marletta Lor, MD as PCP - General (Internal Medicine) [name], MD as Consulting Physician (General Surgery) Nicholas Lose, MD as Consulting Physician (Hematology and Oncology) Gery Pray, MD as Consulting Physician (Radiation Oncology)  DIAGNOSIS:  Encounter Diagnosis  Name Primary?  . Malignant neoplasm of upper-outer quadrant of right breast in female, estrogen receptor positive ([place])     SUMMARY OF ONCOLOGIC HISTORY:   Breast cancer of upper-outer quadrant of right female breast ([place])   01/27/2016 Initial Diagnosis    Right breast biopsy: DCIS with calcifications low to intermediate grade, ER 100%, PR 80%, Tis N0 stage 0; screening mammogram revealed right breast calcifications 8 mm      03/08/2016 Surgery    Right lumpectomy (ingram): DCIS intermediate grade with calcifications, 0.9 cm, margins negative, ER 100%, PR 80%, Tis NX stage 0       Radiation Therapy    Refused XRT      06/22/2016 -  Anti-estrogen oral therapy    Tamoxifen 20mg  daily x 5 years       CHIEF COMPLIANT: Follow-up on tamoxifen therapy  INTERVAL HISTORY: Jocelyn Day is a 75 year old with above-mentioned history of right breast DCIS currently on tamoxifen since November 2017. She is tolerating it extremely well. She denies any significant hot flashes. She had one episode of severe hot flash at [facility] when she was in the kitchen. She denies any lumps or nodules in the breasts.  REVIEW OF SYSTEMS:   Constitutional: Denies fevers, chills or abnormal weight loss Eyes: Denies blurriness of vision Ears, nose, mouth, throat, and face: Denies mucositis or sore throat Respiratory: Denies cough, dyspnea or wheezes Cardiovascular: Denies palpitation, chest discomfort Gastrointestinal:  Denies nausea, heartburn or change in bowel habits Skin: Denies abnormal skin rashes Lymphatics: Denies new lymphadenopathy or easy bruising Neurological:Denies numbness,  tingling or new weaknesses Behavioral/Psych: Mood is stable, no new changes  Extremities: No lower extremity edema Breast:  denies any pain or lumps or nodules in either breasts All other systems were reviewed with the patient and are negative.  I have reviewed the past medical history, past surgical history, social history and family history with the patient and they are unchanged from previous note.  ALLERGIES:  is allergic to no known allergies.  MEDICATIONS:  Current Outpatient Prescriptions  Medication Sig Dispense Refill  . aspirin 81 MG tablet Take 81 mg by mouth daily.      [name] atorvastatin (LIPITOR) 20 MG tablet TAKE 1 TABLET BY MOUTH DAILY 90 tablet 1  . atorvastatin (LIPITOR) 20 MG tablet TAKE 1 TABLET BY MOUTH  DAILY 90 tablet 1  . Calcium Carb-Cholecalciferol (CALCIUM PLUS VITAMIN D3 PO) Take by mouth 2 (two) times daily.      . carvedilol (COREG) 12.5 MG tablet Take 1 tablet (12.5 mg total) by mouth 2 (two) times daily with a meal. 180 tablet 2  . diphenhydrAMINE (BENADRYL ALLERGY) 25 mg capsule Take 25 mg by mouth every 6 (six) hours as needed for sleep.     [name] lisinopril (PRINIVIL,ZESTRIL) 5 MG tablet Take 1 tablet (5 mg total) by mouth daily. *Please call and schedule a one year follow up appointment* 90 tablet 0  . tamoxifen (NOLVADEX) 20 MG tablet Take 1 tablet (20 mg total) by mouth daily. 90 tablet 3   No current facility-administered medications for this visit.     PHYSICAL EXAMINATION: ECOG PERFORMANCE STATUS: 1 - Symptomatic but completely ambulatory  Vitals:   01/03/17 1101  BP: (!) 114/55  Pulse: 63  Resp: 18  Temp: 98.1 F (36.7 C)   Filed Weights   01/03/17 1101  Weight: 130 lb 3.2 oz (59.1 kg)    GENERAL:alert, no distress and comfortable SKIN: skin color, texture, turgor are normal, no rashes or significant lesions EYES: normal, Conjunctiva are pink and non-injected, sclera clear OROPHARYNX:no exudate, no erythema and lips, buccal mucosa, and  tongue normal  NECK: supple, thyroid normal size, non-tender, without nodularity LYMPH:  no palpable lymphadenopathy in the cervical, axillary or inguinal LUNGS: clear to auscultation and percussion with normal breathing effort HEART: regular rate & rhythm and no murmurs and no lower extremity edema ABDOMEN:abdomen soft, non-tender and normal bowel sounds MUSCULOSKELETAL:no cyanosis of digits and no clubbing  NEURO: alert & oriented x 3 with fluent speech, no focal motor/sensory deficits EXTREMITIES: No lower extremity edema  LABORATORY DATA:  I have reviewed the data as listed   Chemistry      Component Value Date/Time   NA 141 07/12/2016 1007   NA 139 02/10/2016 1220   K 4.6 07/12/2016 1007   K 4.9 02/10/2016 1220   CL 103 07/12/2016 1007   CO2 30 07/12/2016 1007   CO2 26 02/10/2016 1220   BUN 13 07/12/2016 1007   BUN 10.8 02/10/2016 1220   CREATININE 0.94 07/12/2016 1007   CREATININE 1.0 02/10/2016 1220      Component Value Date/Time   CALCIUM 9.5 07/12/2016 1007   CALCIUM 9.3 02/10/2016 1220   ALKPHOS 38 (L) 07/12/2016 1007   ALKPHOS 61 02/10/2016 1220   AST 19 07/12/2016 1007   AST 23 02/10/2016 1220   ALT 15 07/12/2016 1007   ALT 15 02/10/2016 1220   BILITOT 0.5 07/12/2016 1007   BILITOT 0.62 02/10/2016 1220       Lab Results  Component Value Date   WBC 7.0 07/12/2016   HGB 14.4 07/12/2016   HCT 42.4 07/12/2016   MCV 96.0 07/12/2016   PLT 254.0 07/12/2016   NEUTROABS 4.7 07/12/2016    ASSESSMENT & PLAN:  Breast cancer of upper-outer quadrant of right female breast ([place]) Right lumpectomy 03/08/2016: DCIS intermediate grade with calcifications, 0.9 cm, margins negative, ER 100%, PR 80%, Tis NX stage 0 Refused adj XRT  Treatment Plan: Tamoxifen 20 mg daily x 5 years started 06/22/2016  Tamoxifen Toxicities: 1. Occasional hot flashes Denies any myalgias  Return to clinic in 1 year for follow-up   I spent 25 minutes talking to the patient of which  more than half was spent in counseling and coordination of care.  No orders of the defined types were placed in this encounter.  The patient has a good understanding of the overall plan. she agrees with it. she will call with any problems that may develop before the next visit here.   Rulon Eisenmenger, MD 01/03/17

## 2017-01-03 NOTE — Assessment & Plan Note (Signed)
Right lumpectomy 03/08/2016: DCIS intermediate grade with calcifications, 0.9 cm, margins negative, ER 100%, PR 80%, Tis NX stage 0 Refused adj XRT  Treatment Plan: Tamoxifen 20 mg daily x 5 years started 06/22/2016  Tamoxifen Toxicities:  Return to clinic in 1 year for follow-up

## 2017-02-07 ENCOUNTER — Encounter: Payer: Self-pay | Admitting: Gynecology

## 2017-02-07 DIAGNOSIS — Z853 Personal history of malignant neoplasm of breast: Secondary | ICD-10-CM | POA: Diagnosis not present

## 2017-02-07 DIAGNOSIS — R921 Mammographic calcification found on diagnostic imaging of breast: Secondary | ICD-10-CM | POA: Diagnosis not present

## 2017-02-07 DIAGNOSIS — M810 Age-related osteoporosis without current pathological fracture: Secondary | ICD-10-CM | POA: Diagnosis not present

## 2017-02-10 ENCOUNTER — Encounter: Payer: Self-pay | Admitting: Gynecology

## 2017-02-13 ENCOUNTER — Encounter: Payer: Self-pay | Admitting: Gynecology

## 2017-03-07 ENCOUNTER — Ambulatory Visit (INDEPENDENT_AMBULATORY_CARE_PROVIDER_SITE_OTHER): Payer: Medicare Other | Admitting: Internal Medicine

## 2017-03-07 ENCOUNTER — Encounter: Payer: Self-pay | Admitting: Internal Medicine

## 2017-03-07 VITALS — BP 160/80 | HR 64 | Temp 98.4°F | Resp 12 | Ht 64.25 in | Wt 130.0 lb

## 2017-03-07 DIAGNOSIS — I10 Essential (primary) hypertension: Secondary | ICD-10-CM | POA: Insufficient documentation

## 2017-03-07 MED ORDER — DIPHENOXYLATE-ATROPINE 2.5-0.025 MG PO TABS: 1.0000 | ORAL_TABLET | Freq: Four times a day (QID) | ORAL | 0 refills | Status: DC | PRN

## 2017-03-07 NOTE — Patient Instructions (Addendum)
Probiotic 1 daily   Food Choices to Help Relieve Diarrhea, Adult When you have diarrhea, the foods you eat and your eating habits are very important. Choosing the right foods and drinks can help:  Relieve diarrhea.  Replace lost fluids and nutrients.  Prevent dehydration.  What general guidelines should I follow? Relieving diarrhea  Choose foods with less than 2 g or .07 oz. of fiber per serving.  Limit fats to less than 8 tsp (38 g or 1.34 oz.) a day.  Avoid the following: ? Foods and beverages sweetened with high-fructose corn syrup, honey, or sugar alcohols such as xylitol, sorbitol, and mannitol. ? Foods that contain a lot of fat or sugar. ? Fried, greasy, or spicy foods. ? High-fiber grains, breads, and cereals. ? Raw fruits and vegetables.  Eat foods that are rich in probiotics. These foods include dairy products such as yogurt and fermented milk products. They help increase healthy bacteria in the stomach and intestines (gastrointestinal tract, or GI tract).  If you have lactose intolerance, avoid dairy products. These may make your diarrhea worse.  Take medicine to help stop diarrhea (antidiarrheal medicine) only as told by your health care provider. Replacing nutrients  Eat small meals or snacks every 3-4 hours.  Eat bland foods, such as white rice, toast, or baked potato, until your diarrhea starts to get better. Gradually reintroduce nutrient-rich foods as tolerated or as told by your health care provider. This includes: ? Well-cooked protein foods. ? Peeled, seeded, and soft-cooked fruits and vegetables. ? Low-fat dairy products.  Take vitamin and mineral supplements as told by your health care provider. Preventing dehydration   Start by sipping water or a special solution to prevent dehydration (oral rehydration solution, ORS). Urine that is clear or pale yellow means that you are getting enough fluid.  Try to drink at least 8-10 cups of fluid each day to  help replace lost fluids.  You may add other liquids in addition to water, such as clear juice or decaffeinated sports drinks, as tolerated or as told by your health care provider.  Avoid drinks with caffeine, such as coffee, tea, or soft drinks.  Avoid alcohol. What foods are recommended? The items listed may not be a complete list. Talk with your health care provider about what dietary choices are best for you. Grains White rice. White, [place], or pita breads (fresh or toasted), including plain rolls, buns, or bagels. White pasta. Saltine, soda, or graham crackers. Pretzels. Low-fiber cereal. Cooked cereals made with water (such as cornmeal, farina, or cream cereals). Plain muffins. Matzo. Melba toast. Zwieback. Vegetables Potatoes (without the skin). Most well-cooked and canned vegetables without skins or seeds. Tender lettuce. Fruits Apple sauce. Fruits canned in juice. Cooked apricots, cherries, grapefruit, peaches, pears, or plums. Fresh bananas and cantaloupe. Meats and other protein foods Baked or boiled chicken. Eggs. Tofu. Fish. Seafood. Smooth nut butters. Ground or well-cooked tender beef, ham, veal, lamb, pork, or poultry. Dairy Plain yogurt, kefir, and unsweetened liquid yogurt. Lactose-free milk, buttermilk, skim milk, or soy milk. Low-fat or nonfat hard cheese. Beverages Water. Low-calorie sports drinks. Fruit juices without pulp. Strained tomato and vegetable juices. Decaffeinated teas. Sugar-free beverages not sweetened with sugar alcohols. Oral rehydration solutions, if approved by your health care provider. Seasoning and other foods Bouillon, broth, or soups made from recommended foods. What foods are not recommended? The items listed may not be a complete list. Talk with your health care provider about what dietary choices are best for you.  Grains Whole grain, whole wheat, bran, or rye breads, rolls, pastas, and crackers. Wild or brown rice. Whole grain or bran cereals.  Barley. Oats and oatmeal. Corn tortillas or taco shells. Granola. Popcorn. Vegetables Raw vegetables. Fried vegetables. Cabbage, broccoli, Brussels sprouts, artichokes, baked beans, beet greens, corn, kale, legumes, peas, sweet potatoes, and yams. Potato skins. Cooked spinach and cabbage. Fruits Dried fruit, including raisins and dates. Raw fruits. Stewed or dried prunes. Canned fruits with syrup. Meat and other protein foods Fried or fatty meats. Deli meats. Chunky nut butters. Nuts and seeds. Beans and lentils. Berniece Salines. Hot dogs. Sausage. Dairy High-fat cheeses. Whole milk, chocolate milk, and beverages made with milk, such as milk shakes. Half-and-half. Cream. sour cream. Ice cream. Beverages Caffeinated beverages (such as coffee, tea, soda, or energy drinks). Alcoholic beverages. Fruit juices with pulp. Prune juice. Soft drinks sweetened with high-fructose corn syrup or sugar alcohols. High-calorie sports drinks. Fats and oils Butter. Cream sauces. Margarine. Salad oils. Plain salad dressings. Olives. Avocados. Mayonnaise. Sweets and desserts Sweet rolls, doughnuts, and sweet breads. Sugar-free desserts sweetened with sugar alcohols such as xylitol and sorbitol. Seasoning and other foods Honey. Hot sauce. Chili powder. Gravy. Cream-based or milk-based soups. Pancakes and waffles. Summary  When you have diarrhea, the foods you eat and your eating habits are very important.  Make sure you get at least 8-10 cups of fluid each day, or enough to keep your urine clear or pale yellow.  Eat bland foods and gradually reintroduce healthy, nutrient-rich foods as tolerated, or as told by your health care provider.  Avoid high-fiber, fried, greasy, or spicy foods. This information is not intended to replace advice given to you by your health care provider. Make sure you discuss any questions you have with your health care provider. Document Released: 10/29/2003 Document Revised: 08/05/2016 Document  Reviewed: 08/05/2016 Elsevier Interactive Patient Education  2017 [employer].

## 2017-03-07 NOTE — Progress Notes (Signed)
Subjective:    Patient ID: Jocelyn Day, female    DOB: 1941-04-02, 76 y.o.   MRN: [id number]  HPI  76 year old patient who has essential hypertension.  She presents with a three-week history of diarrhea.  She states this began 3 weeks ago while having a family reunion at the beach.  This was quite stressful with the recent diagnosis of a grandson with diabetes. She describes basically one loose stool daily.  Today she had 2 or 3 loose bowel movements but generally does not have this frequency.  There is been no anorexia or weight loss or nausea  Past Medical History:  Diagnosis Date  . ACS (acute coronary syndrome) ([place]) 10/20/2010   Normal coronaries per cath 2/12  . Atrophic vaginitis   . Breast cancer of upper-outer quadrant of right female breast ([place]) 01/28/2016  . Cancer ([place])    skin cancer-basal cell  . History of kidney stones   . Hypertension   . Insomnia   . Myocardial infarction ([place])    taksubo syndrome  . Osteoporosis 01/2017   T score -2.5 overall stable from prior DEXA  . Takotsubo cardiomyopathy 10/21/2010   Acute coronary syndrome/Takotsubo cardiomyopathy     Social History   Social History  . Marital status: Married    Spouse name: N/A  . Number of children: 2  . Years of education: N/A   Occupational History  . Not on file.   Social History Main Topics  . Smoking status: Never Smoker  . Smokeless tobacco: Never Used  . Alcohol use 0.0 oz/week     Comment: once or twice  a month a glasses of wine  . Drug use: No  . Sexual activity: No     Comment: 1st intercourse 6 yo-1 partner   Other Topics Concern  . Not on file   Social History Narrative  . No narrative on file    Past Surgical History:  Procedure Laterality Date  . basal cell carinoma excised  2005   face ,breast rt squamous cell 2016  . BREAST BIOPSY  01/27/2016   Malignant  . BREAST LUMPECTOMY WITH RADIOACTIVE SEED LOCALIZATION Right 03/08/2016   Procedure: RIGHT BREAST LUMPECTOMY  WITH RADIOACTIVE SEED LOCALIZATION;  Surgeon: [name], MD;  Location: [facility];  Service: General;  Laterality: Right;  . CARDIAC CATHETERIZATION  10/21/2010   Normal coronary arteries -- Apical ballooning syndrome consistent with tako-tsubo cardiomyopathy with an ejection fraction of 30%  . CESAREAN SECTION  1974  . [place]  . CHOLECYSTECTOMY  1993  . COLONOSCOPY    . DILATION AND CURETTAGE OF UTERUS  1990  . [place]  2010  . POLYPECTOMY      Family History  Problem Relation Age of Onset  . Hypertension Mother   . Cancer Father        prostate  . Diabetes Brother        liver translplant and medication induced the diabetes  . Diabetes Paternal Uncle   . Diabetes Cousin   . Cervical cancer Sister   . Heart disease Neg Hx   . Colon cancer Neg Hx     Allergies  Allergen Reactions  . No Known Allergies     Current Outpatient Prescriptions on File Prior to Visit  Medication Sig Dispense Refill  . aspirin 81 MG tablet Take 81 mg by mouth daily.      [name] atorvastatin (LIPITOR) 20 MG tablet TAKE 1 TABLET BY MOUTH  DAILY 90 tablet 1  . Calcium Carb-Cholecalciferol (CALCIUM PLUS VITAMIN D3 PO) Take by mouth 2 (two) times daily.      . carvedilol (COREG) 12.5 MG tablet Take 1 tablet (12.5 mg total) by mouth 2 (two) times daily with a meal. 180 tablet 2  . lisinopril (PRINIVIL,ZESTRIL) 5 MG tablet Take 1 tablet (5 mg total) by mouth daily. *Please call and schedule a one year follow up appointment* 90 tablet 0  . tamoxifen (NOLVADEX) 20 MG tablet Take 1 tablet (20 mg total) by mouth daily. 90 tablet 3   No current facility-administered medications on file prior to visit.     BP (!) 160/80 (BP Location: Left Arm, Patient Position: Sitting, Cuff Size: Normal)   Pulse 64   Temp 98.4 F (36.9 C) (Oral)   Resp 12   Ht 5' 4.25" (1.632 m)   Wt 130 lb (59 kg)   SpO2 98%   BMI 22.14 kg/m     Review of Systems  Constitutional: Negative.   HENT:  Negative for congestion, dental problem, hearing loss, rhinorrhea, sinus pressure, sore throat and tinnitus.   Eyes: Negative for pain, discharge and visual disturbance.  Respiratory: Negative for cough and shortness of breath.   Cardiovascular: Negative for chest pain, palpitations and leg swelling.  Gastrointestinal: Positive for diarrhea. Negative for abdominal distention, abdominal pain, blood in stool, constipation, nausea and vomiting.  Genitourinary: Negative for difficulty urinating, dysuria, flank pain, frequency, hematuria, pelvic pain, urgency, vaginal bleeding, vaginal discharge and vaginal pain.  Musculoskeletal: Negative for arthralgias, gait problem and joint swelling.  Skin: Negative for rash.  Neurological: Negative for dizziness, syncope, speech difficulty, weakness, numbness and headaches.  Hematological: Negative for adenopathy.  Psychiatric/Behavioral: Negative for agitation, behavioral problems and dysphoric mood. The patient is not nervous/anxious.        Objective:   Physical Exam  Constitutional: She is oriented to person, place, and time. She appears well-developed and well-nourished. No distress.  HENT:  Head: Normocephalic.  Right Ear: External ear normal.  Left Ear: External ear normal.  Mouth/Throat: Oropharynx is clear and moist.  Eyes: Pupils are equal, round, and reactive to light. Conjunctivae and EOM are normal.  Neck: Normal range of motion. Neck supple. No thyromegaly present.  Cardiovascular: Normal rate, regular rhythm, normal heart sounds and intact distal pulses.   Pulmonary/Chest: Effort normal and breath sounds normal.  Abdominal: Soft. Bowel sounds are normal. She exhibits no distension and no mass. There is no tenderness. There is no rebound and no guarding.  Musculoskeletal: Normal range of motion.  Lymphadenopathy:    She has no cervical adenopathy.  Neurological: She is alert and oriented to person, place, and time.  Skin: Skin is warm  and dry. No rash noted.  Psychiatric: She has a normal mood and affect. Her behavior is normal.          Assessment & Plan:   Change in bowel habits.  Will place on probiotic for 20 days and samples provided.  Will place on low residue diet.  Will call if she fails to improve Essential hypertension, stable  Nyoka Cowden

## 2017-03-16 ENCOUNTER — Telehealth: Payer: Self-pay | Admitting: Internal Medicine

## 2017-03-16 ENCOUNTER — Encounter: Payer: Self-pay | Admitting: Internal Medicine

## 2017-03-16 ENCOUNTER — Ambulatory Visit (INDEPENDENT_AMBULATORY_CARE_PROVIDER_SITE_OTHER): Payer: Medicare Other | Admitting: Internal Medicine

## 2017-03-16 VITALS — BP 120/60 | HR 53 | Ht 64.75 in | Wt 127.0 lb

## 2017-03-16 DIAGNOSIS — A09 Infectious gastroenteritis and colitis, unspecified: Secondary | ICD-10-CM | POA: Diagnosis not present

## 2017-03-16 DIAGNOSIS — R194 Change in bowel habit: Secondary | ICD-10-CM

## 2017-03-16 MED ORDER — METRONIDAZOLE 250 MG PO TABS: 250.0000 mg | ORAL_TABLET | Freq: Three times a day (TID) | ORAL | 0 refills | Status: DC

## 2017-03-16 NOTE — Patient Instructions (Signed)
We have sent the following medications to your pharmacy for you to pick up at your convenience:  Flagyl  Continue Imodium as needed

## 2017-03-16 NOTE — Progress Notes (Signed)
HISTORY OF PRESENT ILLNESS:  Jocelyn Day is a 76 y.o. female with past medical history as listed below who is self-referred regarding change in bowel habits and concerns of cancer. I last saw the patient may 13th 2014 when she underwent routine surveillance colonoscopy. This was normal except for diverticulosis. No further surveillance felt necessary. She tells me that she didn't usual state of health until approximately 4 weeks ago when while at the beach noticed slightly more frequent soft stools which were occasionally loose. Presence of soft stools have persisted. This worries her. She wonders about pancreatic or colon cancer. She denies abdominal pain, weight loss, bleeding, fever, nausea, vomiting, or diarrhea. She has had some bloating and gas. She saw her PCP who recommended probiotic and Lomotil. She states that she takes Lomotil 4 times daily her bowel movements are solid. She describes herself as somewhat having a nervous stomach. With anxiety she may have nausea with vomiting. She tells me that her grandson was diagnosed with diabetes recently. GI review of systems otherwise negative. She denies new medications or over-the-counter agents  REVIEW OF SYSTEMS:  All non-GI ROS negative except for anxiety  Past Medical History:  Diagnosis Date  . ACS (acute coronary syndrome) ([place]) 10/20/2010   Normal coronaries per cath 2/12  . Atrophic vaginitis   . Breast cancer of upper-outer quadrant of right female breast ([place]) 01/28/2016  . Cancer ([place])    skin cancer-basal cell  . Diverticulosis   . History of kidney stones   . Hypertension   . Insomnia   . Myocardial infarction ([place])    taksubo syndrome  . Osteoporosis 01/2017   T score -2.5 overall stable from prior DEXA  . Takotsubo cardiomyopathy 10/21/2010   Acute coronary syndrome/Takotsubo cardiomyopathy    Past Surgical History:  Procedure Laterality Date  . basal cell carinoma excised  2005   face ,breast rt squamous cell 2016  .  BREAST BIOPSY  01/27/2016   Malignant  . BREAST LUMPECTOMY WITH RADIOACTIVE SEED LOCALIZATION Right 03/08/2016   Procedure: RIGHT BREAST LUMPECTOMY WITH RADIOACTIVE SEED LOCALIZATION;  Surgeon: [name], MD;  Location: [facility];  Service: General;  Laterality: Right;  . CARDIAC CATHETERIZATION  10/21/2010   Normal coronary arteries -- Apical ballooning syndrome consistent with tako-tsubo cardiomyopathy with an ejection fraction of 30%  . CESAREAN SECTION  1974  . [place]  . CHOLECYSTECTOMY  1993  . COLONOSCOPY    . DILATION AND CURETTAGE OF UTERUS  1990  . [place]  2010  . POLYPECTOMY      Social History Jocelyn Day  reports that she has never smoked. She has never used smokeless tobacco. She reports that she drinks alcohol. She reports that she does not use drugs.  family history includes Cancer in her father; Cervical cancer in her sister; Diabetes in her brother, cousin, and paternal uncle; Hypertension in her mother.  Allergies  Allergen Reactions  . No Known Allergies        PHYSICAL EXAMINATION: Vital signs: BP 120/60   Pulse (!) 53   Ht 5' 4.75" (1.645 m)   Wt 127 lb (57.6 kg)   BMI 21.30 kg/m   Constitutional:Pleasant, generally well-appearing, no acute distress Psychiatric: alert and oriented x3, cooperative. Anxious Eyes: extraocular movements intact, anicteric, conjunctiva pink Mouth: oral pharynx moist, no lesions Neck: supple no lymphadenopathy Cardiovascular: heart regular rate and rhythm, no murmur Lungs: clear to auscultation bilaterally Abdomen: soft, nontender, nondistended, no obvious ascites, no peritoneal  signs, normal bowel sounds, no organomegaly Rectal: Omitted Extremities: no clubbing cyanosis or lower extremity edema bilaterally Skin: no lesions on visible extremities Neuro: No focal deficits. Cranial nerves intact  ASSESSMENT:  #1. Change in bowel habits with tendency toward diarrhea as described. Suspect  infectious agent, likely self-limited. Could be Giardia #2. Health related anxiety #3. History of adenomatous colon polyps. Multiple prior colonoscopies. Last examination 2014 negative except for diverticulosis   PLAN:  #1. Empiric course of metronidazole 250 mg 3 times a day for one week #2. Antidiarrheals as needed #3. Reassurance #4. Follow-up as needed. Parameters given

## 2017-03-17 MED ORDER — METRONIDAZOLE 250 MG PO TABS: 250.0000 mg | ORAL_TABLET | Freq: Three times a day (TID) | ORAL | 0 refills | Status: DC

## 2017-03-17 NOTE — Telephone Encounter (Signed)
Flagyl sent to [facility] on [address]

## 2017-03-20 ENCOUNTER — Other Ambulatory Visit: Payer: Self-pay | Admitting: Cardiovascular Disease

## 2017-03-21 ENCOUNTER — Telehealth: Payer: Self-pay | Admitting: Internal Medicine

## 2017-03-21 ENCOUNTER — Encounter: Payer: Self-pay | Admitting: Internal Medicine

## 2017-03-21 ENCOUNTER — Ambulatory Visit (INDEPENDENT_AMBULATORY_CARE_PROVIDER_SITE_OTHER): Payer: Medicare Other | Admitting: Internal Medicine

## 2017-03-21 VITALS — BP 118/64 | HR 88 | Temp 98.5°F | Ht 64.75 in | Wt 125.4 lb

## 2017-03-21 DIAGNOSIS — I10 Essential (primary) hypertension: Secondary | ICD-10-CM

## 2017-03-21 DIAGNOSIS — A09 Infectious gastroenteritis and colitis, unspecified: Secondary | ICD-10-CM

## 2017-03-21 DIAGNOSIS — E785 Hyperlipidemia, unspecified: Secondary | ICD-10-CM

## 2017-03-21 LAB — CBC WITH DIFFERENTIAL/PLATELET
BASOS ABS: 0 10*3/uL (ref 0.0–0.1)
BASOS PCT: 0.7 % (ref 0.0–3.0)
EOS ABS: 0 10*3/uL (ref 0.0–0.7)
Eosinophils Relative: 0.1 % (ref 0.0–5.0)
HCT: 40.3 % (ref 36.0–46.0)
HEMOGLOBIN: 13.7 g/dL (ref 12.0–15.0)
LYMPHS PCT: 20.3 % (ref 12.0–46.0)
Lymphs Abs: 1 10*3/uL (ref 0.7–4.0)
MCHC: 34 g/dL (ref 30.0–36.0)
MCV: 97.7 fl (ref 78.0–100.0)
MONO ABS: 0.6 10*3/uL (ref 0.1–1.0)
Monocytes Relative: 10.8 % (ref 3.0–12.0)
NEUTROS PCT: 68.1 % (ref 43.0–77.0)
Neutro Abs: 3.5 10*3/uL (ref 1.4–7.7)
Platelets: 143 10*3/uL — ABNORMAL LOW (ref 150.0–400.0)
RBC: 4.13 Mil/uL (ref 3.87–5.11)
RDW: 12 % (ref 11.5–15.5)
WBC: 5.1 10*3/uL (ref 4.0–10.5)

## 2017-03-21 LAB — COMPREHENSIVE METABOLIC PANEL
ALBUMIN: 3.8 g/dL (ref 3.5–5.2)
ALK PHOS: 32 U/L — AB (ref 39–117)
ALT: 30 U/L (ref 0–35)
AST: 40 U/L — AB (ref 0–37)
BILIRUBIN TOTAL: 0.4 mg/dL (ref 0.2–1.2)
BUN: 8 mg/dL (ref 6–23)
CALCIUM: 8.5 mg/dL (ref 8.4–10.5)
CO2: 29 mEq/L (ref 19–32)
CREATININE: 0.79 mg/dL (ref 0.40–1.20)
Chloride: 102 mEq/L (ref 96–112)
GFR: 75.44 mL/min (ref >60.00)
Glucose, Bld: 150 mg/dL — ABNORMAL HIGH (ref 70–99)
Potassium: 3.1 mEq/L — ABNORMAL LOW (ref 3.5–5.1)
SODIUM: 138 meq/L (ref 135–145)
TOTAL PROTEIN: 6.1 g/dL (ref 6.0–8.3)

## 2017-03-21 LAB — TSH: TSH: 2.09 u[IU]/mL (ref 0.35–4.50)

## 2017-03-21 NOTE — Telephone Encounter (Signed)
She is quite anxious. I think this is at least part of the problem. Her weight is only 2 pounds different than exactly 1 year ago today. No additional recommendations at this time

## 2017-03-21 NOTE — Telephone Encounter (Signed)
Please note

## 2017-03-21 NOTE — Telephone Encounter (Signed)
Spoke with pt and she is aware. She states she has been taking a probiotic and it has not helped. She is really concerned about the fever she started running Friday evening. The highest her temp was over the weekend was 100.6, today it is 99.2. She is also worried about her weight loss. Discussed with her that her temp was very low grade and that she may have a virus that she may need to see her PCP for that. She verbalized understanding but still wants to know what to do regarding the weight loss. Please advise.

## 2017-03-21 NOTE — Telephone Encounter (Signed)
Pt states she was seen Thursday and placed on Flagyl. She took it Thursday night and Friday and states she felt terrible. Reports she ran a fever Friday evening so she stopped the Flagyl and felt better on Saturday and Sunday. She took it again Monday and felt bad again. Pt reports she just cannot take this medicine. Pt reports she is still having the loose stools. Please advise.

## 2017-03-21 NOTE — Telephone Encounter (Signed)
Spoke with pt and she is aware and may try the align and metamucil. Pt has an appt today with her PCP also.

## 2017-03-21 NOTE — Progress Notes (Signed)
Subjective:    Patient ID: Jocelyn Day, female    DOB: 1941-07-01, 76 y.o.   MRN: [id number]  HPI  76 year old patient who is seen today for follow-up of diarrhea.  This has been present for approximately 5 weeks.  Stools are described as loose.she was seen in consultation by GI.  5 days ago and mentioned and resolved was prescribed.  This she tolerated poorly and she feels this caused worsening diarrhea, nausea and general sense of unwellness.  She took a single dose yesterday morning and today feels much improved.  Yesterday she states that she had 2 loose bowel movements and today has had no bowel movements. She has been on a probiotic and Metamucil has been recommended.  Wt Readings from Last 3 Encounters:  03/21/17 125 lb 6.4 oz (56.9 kg)  03/16/17 127 lb (57.6 kg)  03/07/17 130 lb (59 kg)   She continues to lose weight but states her appetite is well maintained.  Over the weekend she felt that she had a low-grade fever.  This has normalized off metronidazole  Past Medical History:  Diagnosis Date  . ACS (acute coronary syndrome) ([facility]) 10/20/2010   Normal coronaries per cath 2/12  . Atrophic vaginitis   . Breast cancer of upper-outer quadrant of right female breast ([facility]) 01/28/2016  . Cancer ([facility])    skin cancer-basal cell  . Diverticulosis   . History of kidney stones   . Hypertension   . Insomnia   . Myocardial infarction ([facility])    taksubo syndrome  . Osteoporosis 01/2017   T score -2.5 overall stable from prior DEXA  . Takotsubo cardiomyopathy 10/21/2010   Acute coronary syndrome/Takotsubo cardiomyopathy     Social History   Social History  . Marital status: Married    Spouse name: N/A  . Number of children: 2  . Years of education: N/A   Occupational History  . Not on file.   Social History Main Topics  . Smoking status: Never Smoker  . Smokeless tobacco: Never Used  . Alcohol use 0.0 oz/week     Comment: once or twice  a month a glasses of wine  . Drug  use: No  . Sexual activity: No     Comment: 1st intercourse 25 yo-1 partner   Other Topics Concern  . Not on file   Social History Narrative  . No narrative on file    Past Surgical History:  Procedure Laterality Date  . basal cell carinoma excised  2005   face ,breast rt squamous cell 2016  . BREAST BIOPSY  01/27/2016   Malignant  . BREAST LUMPECTOMY WITH RADIOACTIVE SEED LOCALIZATION Right 03/08/2016   Procedure: RIGHT BREAST LUMPECTOMY WITH RADIOACTIVE SEED LOCALIZATION;  Surgeon: [name], MD;  Location: [facility];  Service: General;  Laterality: Right;  . CARDIAC CATHETERIZATION  10/21/2010   Normal coronary arteries -- Apical ballooning syndrome consistent with tako-tsubo cardiomyopathy with an ejection fraction of 30%  . CESAREAN SECTION  1974  . [place]  . CHOLECYSTECTOMY  1993  . COLONOSCOPY    . DILATION AND CURETTAGE OF UTERUS  1990  . [place]  2010  . POLYPECTOMY      Family History  Problem Relation Age of Onset  . Hypertension Mother   . Cancer Father        prostate  . Diabetes Brother        liver translplant and medication induced the diabetes  . Diabetes Paternal Uncle   .  Diabetes Cousin   . Cervical cancer Sister   . Heart disease Neg Hx   . Colon cancer Neg Hx     Allergies  Allergen Reactions  . No Known Allergies     Current Outpatient Prescriptions on File Prior to Visit  Medication Sig Dispense Refill  . aspirin 81 MG tablet Take 81 mg by mouth daily.      [name] atorvastatin (LIPITOR) 20 MG tablet TAKE 1 TABLET BY MOUTH  DAILY 90 tablet 1  . Calcium Carb-Cholecalciferol (CALCIUM PLUS VITAMIN D3 PO) Take by mouth 2 (two) times daily.      . carvedilol (COREG) 12.5 MG tablet Take 1 tablet (12.5 mg total) by mouth 2 (two) times daily with a meal. 180 tablet 0  . diphenoxylate-atropine (LOMOTIL) 2.5-0.025 MG tablet Take 1 tablet by mouth 4 (four) times daily as needed for diarrhea or loose stools. 30 tablet 0  .  lisinopril (PRINIVIL,ZESTRIL) 5 MG tablet Take 1 tablet (5 mg total) by mouth daily. *Please call and schedule a one year follow up appointment* 90 tablet 0  . tamoxifen (NOLVADEX) 20 MG tablet Take 1 tablet (20 mg total) by mouth daily. 90 tablet 3  . metroNIDAZOLE (FLAGYL) 250 MG tablet Take 1 tablet (250 mg total) by mouth 3 (three) times daily. (Patient not taking: Reported on 03/21/2017) 21 tablet 0   No current facility-administered medications on file prior to visit.     BP 118/64 (BP Location: Left Arm, Patient Position: Sitting, Cuff Size: Normal)   Pulse 88   Temp 98.5 F (36.9 C) (Oral)   Ht 5' 4.75" (1.645 m)   Wt 125 lb 6.4 oz (56.9 kg)   SpO2 97%   BMI 21.03 kg/m     Review of Systems  Constitutional: Positive for activity change, fatigue, fever and unexpected weight change.  HENT: Negative for congestion, dental problem, hearing loss, rhinorrhea, sinus pressure, sore throat and tinnitus.   Eyes: Negative for pain, discharge and visual disturbance.  Respiratory: Negative for cough and shortness of breath.   Cardiovascular: Negative for chest pain, palpitations and leg swelling.  Gastrointestinal: Positive for diarrhea. Negative for abdominal distention, abdominal pain, blood in stool, constipation, nausea and vomiting.  Genitourinary: Negative for difficulty urinating, dysuria, flank pain, frequency, hematuria, pelvic pain, urgency, vaginal bleeding, vaginal discharge and vaginal pain.  Musculoskeletal: Negative for arthralgias, gait problem and joint swelling.  Skin: Negative for rash.  Neurological: Negative for dizziness, syncope, speech difficulty, weakness, numbness and headaches.  Hematological: Negative for adenopathy.  Psychiatric/Behavioral: Negative for agitation, behavioral problems and dysphoric mood. The patient is not nervous/anxious.        Objective:   Physical Exam  Constitutional: She is oriented to person, place, and time. She appears  well-developed and well-nourished. No distress.  Clinically, appears well and in no distress Afebrile Blood pressure normal Pulse rate not elevated O2 saturation 97%  HENT:  Head: Normocephalic.  Right Ear: External ear normal.  Left Ear: External ear normal.  Mouth/Throat: Oropharynx is clear and moist.  Oropharynx appeared well hydrated  Eyes: Pupils are equal, round, and reactive to light. Conjunctivae and EOM are normal.  Neck: Normal range of motion. Neck supple. No thyromegaly present.  Cardiovascular: Normal rate, regular rhythm, normal heart sounds and intact distal pulses.   Pulmonary/Chest: Effort normal and breath sounds normal.  Abdominal: Soft. Bowel sounds are normal. She exhibits no mass. There is no tenderness.  Musculoskeletal: Normal range of motion.  Lymphadenopathy:  She has no cervical adenopathy.  Neurological: She is alert and oriented to person, place, and time.  Skin: Skin is warm and dry. No rash noted.  Psychiatric: She has a normal mood and affect. Her behavior is normal.          Assessment & Plan:   Persistent diarrhea.  Will check screening lab and stool sample for enteric pathogens We'll continue probiotic and Metamucil supplement Patient will report any clinical worsening  Nyoka Cowden

## 2017-03-21 NOTE — Patient Instructions (Signed)
Call or return to clinic prn if these symptoms worsen or fail to improve as anticipated.  GI follow-up if unimproved

## 2017-03-21 NOTE — Telephone Encounter (Signed)
Probiotic align one daily for one month and Metamucil 1-2 tablespoons daily

## 2017-03-21 NOTE — Telephone Encounter (Signed)
Pt calling stating that she has a fever and losing weight.  Pt would like to have blood work done to see what is going on with her since she stills has loose stool.   Pt decided to make an appointment for today at 12:45

## 2017-03-22 ENCOUNTER — Other Ambulatory Visit: Payer: Self-pay | Admitting: Internal Medicine

## 2017-03-22 ENCOUNTER — Other Ambulatory Visit: Payer: Self-pay

## 2017-03-22 MED ORDER — POTASSIUM CHLORIDE CRYS ER 20 MEQ PO TBCR: 20.0000 meq | EXTENDED_RELEASE_TABLET | Freq: Two times a day (BID) | ORAL | 0 refills | Status: DC

## 2017-03-23 LAB — GASTROINTESTINAL PATHOGEN PANEL PCR
C. difficile Tox A/B, PCR: NOT DETECTED
Campylobacter, PCR: NOT DETECTED
Cryptosporidium, PCR: NOT DETECTED
E COLI 0157, PCR: NOT DETECTED
E coli (ETEC) LT/ST PCR: NOT DETECTED
E coli (STEC) stx1/stx2, PCR: NOT DETECTED
Giardia lamblia, PCR: NOT DETECTED
Norovirus, PCR: NOT DETECTED
Rotavirus A, PCR: NOT DETECTED
SHIGELLA, PCR: NOT DETECTED
Salmonella, PCR: DETECTED — CR

## 2017-03-24 ENCOUNTER — Telehealth: Payer: Self-pay

## 2017-03-24 NOTE — Telephone Encounter (Signed)
Dr. Raliegh Ip - FYI  Pt was rx'd Cipro by Dr. Alain Marion to treat POS Salmonella. Pt aware.   [facility] Patient Name: Jocelyn Day Gender: Female DOB: [birth year] Age: 76 Y 10 M 13 D Return Phone Number: City/State/Zip: [place] [employer] [facility] [place] Night - Client Client Site [place] - Night Physician Simonne Martinet - MD Who Is Calling Lab Lab Name [facility] Lab Phone Number [phone] Opt 2 Lab Tech Name [facility] Lab Reference Number none Chief Complaint Lab Result (Critical or Stat) Call Type Lab Send to RN Reason for Call Report lab results Initial Comment Brandi with [facility] , [phone] opt 2. Has a critical lab . [phone] opt 2 Please document the following items: Lab name Lab value (read back to lab to verify) Reference range for lab value Date and time blood was drawn Collect time of birth for bilirubin results ---Stool sample for Gastro pathogen panel detected Salmonella and nothing else detected sample taken on March 21, 2017 at 1341. Verbal Orders/Maintenance Medications Medication Refill Route Dosage Regime Duration Admin Instructions User Name Cipro Oral 500 mg 5 Days Cipro 500 mg bid for 5 days disp 10 tablets no refills. Lois Huxley, RN, [employer] Phone DateTime Action Result/Outcome Notes Walker Kehr - MD [phone] 03/23/2017 7:31:41 PM Doctor Paged Called On Call Provider - Sigmund Hazel - MD 03/23/2017 7:35:31 PM Message Result Paged On Call to the Patient Dr. Alain Marion states if patient is not on a medication for treatment of salmonella and showing symptoms then to call in verbal medication order : cipro 500 mg bid for 5 days disp 10 tablets with no refills.

## 2017-04-03 ENCOUNTER — Encounter: Payer: Self-pay | Admitting: Internal Medicine

## 2017-04-03 ENCOUNTER — Ambulatory Visit (INDEPENDENT_AMBULATORY_CARE_PROVIDER_SITE_OTHER): Payer: Medicare Other | Admitting: Internal Medicine

## 2017-04-03 ENCOUNTER — Telehealth: Payer: Self-pay | Admitting: Internal Medicine

## 2017-04-03 VITALS — BP 122/68 | HR 74 | Temp 98.1°F | Ht 64.75 in | Wt 121.6 lb

## 2017-04-03 DIAGNOSIS — R634 Abnormal weight loss: Secondary | ICD-10-CM | POA: Diagnosis not present

## 2017-04-03 DIAGNOSIS — I10 Essential (primary) hypertension: Secondary | ICD-10-CM | POA: Diagnosis not present

## 2017-04-03 DIAGNOSIS — R531 Weakness: Secondary | ICD-10-CM | POA: Diagnosis not present

## 2017-04-03 DIAGNOSIS — A020 Salmonella enteritis: Secondary | ICD-10-CM

## 2017-04-03 MED ORDER — AZITHROMYCIN 250 MG PO TABS: ORAL_TABLET | ORAL | 0 refills | Status: DC

## 2017-04-03 NOTE — Telephone Encounter (Signed)
Pt now req an appt and is sch for today

## 2017-04-03 NOTE — Telephone Encounter (Signed)
Pt has an appt with her heart doctor on Friday. Pt would like to have her potassium recheck. Can I sch?

## 2017-04-03 NOTE — Progress Notes (Signed)
Subjective:    Patient ID: Jocelyn Day, female    DOB: 08/25/1940, 76 y.o.   MRN: [id number]  HPI  76 year old patient who has been treated recently for Salmonella gastroenteritis confirmed on stool culture.  She was treated with Cipro and is seen today in follow-up complaining of persistent weakness, lethargy, poor appetite.  She has had some nausea, weight loss and intermittent low-grade fever. Diarrhea has resolved.  She does take some Metamucil but has one or 2 formed stools per day.  She is concerned about Salmonella bacteremia  Wt Readings from Last 3 Encounters:  04/03/17 121 lb 9.6 oz (55.2 kg)  03/21/17 125 lb 6.4 oz (56.9 kg)  03/16/17 127 lb (57.6 kg)   . Past Medical History:  Diagnosis Date  . ACS (acute coronary syndrome) ([facility]) 10/20/2010   Normal coronaries per cath 2/12  . Atrophic vaginitis   . Breast cancer of upper-outer quadrant of right female breast ([facility]) 01/28/2016  . Cancer ([facility])    skin cancer-basal cell  . Diverticulosis   . History of kidney stones   . Hypertension   . Insomnia   . Myocardial infarction ([facility])    taksubo syndrome  . Osteoporosis 01/2017   T score -2.5 overall stable from prior DEXA  . Takotsubo cardiomyopathy 10/21/2010   Acute coronary syndrome/Takotsubo cardiomyopathy     Social History   Social History  . Marital status: Married    Spouse name: N/A  . Number of children: 2  . Years of education: N/A   Occupational History  . Not on file.   Social History Main Topics  . Smoking status: Never Smoker  . Smokeless tobacco: Never Used  . Alcohol use 0.0 oz/week     Comment: once or twice  a month a glasses of wine  . Drug use: No  . Sexual activity: No     Comment: 1st intercourse 43 yo-1 partner   Other Topics Concern  . Not on file   Social History Narrative  . No narrative on file    Past Surgical History:  Procedure Laterality Date  . basal cell carinoma excised  2005   face ,breast rt squamous cell 2016  .  BREAST BIOPSY  01/27/2016   Malignant  . BREAST LUMPECTOMY WITH RADIOACTIVE SEED LOCALIZATION Right 03/08/2016   Procedure: RIGHT BREAST LUMPECTOMY WITH RADIOACTIVE SEED LOCALIZATION;  Surgeon: [name], MD;  Location: [facility];  Service: General;  Laterality: Right;  . CARDIAC CATHETERIZATION  10/21/2010   Normal coronary arteries -- Apical ballooning syndrome consistent with tako-tsubo cardiomyopathy with an ejection fraction of 30%  . CESAREAN SECTION  1974  . [place]  . CHOLECYSTECTOMY  1993  . COLONOSCOPY    . DILATION AND CURETTAGE OF UTERUS  1990  . [place]  2010  . POLYPECTOMY      Family History  Problem Relation Age of Onset  . Hypertension Mother   . Cancer Father        prostate  . Diabetes Brother        liver translplant and medication induced the diabetes  . Diabetes Paternal Uncle   . Diabetes Cousin   . Cervical cancer Sister   . Heart disease Neg Hx   . Colon cancer Neg Hx     Allergies  Allergen Reactions  . No Known Allergies     Current Outpatient Prescriptions on File Prior to Visit  Medication Sig Dispense Refill  . aspirin 81  MG tablet Take 81 mg by mouth daily.      [name] atorvastatin (LIPITOR) 20 MG tablet TAKE 1 TABLET BY MOUTH  DAILY 90 tablet 1  . Calcium Carb-Cholecalciferol (CALCIUM PLUS VITAMIN D3 PO) Take by mouth 2 (two) times daily.      . carvedilol (COREG) 12.5 MG tablet Take 1 tablet (12.5 mg total) by mouth 2 (two) times daily with a meal. 180 tablet 0  . diphenoxylate-atropine (LOMOTIL) 2.5-0.025 MG tablet Take 1 tablet by mouth 4 (four) times daily as needed for diarrhea or loose stools. 30 tablet 0  . lisinopril (PRINIVIL,ZESTRIL) 5 MG tablet Take 1 tablet (5 mg total) by mouth daily. *Please call and schedule a one year follow up appointment* 90 tablet 0  . potassium chloride SA (K-DUR,KLOR-CON) 20 MEQ tablet TAKE 1 TABLET(20 MEQ) BY MOUTH TWICE DAILY 180 tablet 0  . tamoxifen (NOLVADEX) 20 MG tablet Take 1  tablet (20 mg total) by mouth daily. 90 tablet 3   No current facility-administered medications on file prior to visit.     BP 122/68 (BP Location: Left Arm, Patient Position: Sitting, Cuff Size: Normal)   Pulse 74   Temp 98.1 F (36.7 C) (Oral)   Ht 5' 4.75" (1.645 m)   Wt 121 lb 9.6 oz (55.2 kg)   SpO2 98%   BMI 20.39 kg/m     Review of Systems  Constitutional: Positive for activity change, appetite change, fatigue, fever and unexpected weight change. Negative for chills and diaphoresis.  HENT: Negative for congestion, dental problem, hearing loss, rhinorrhea, sinus pressure, sore throat and tinnitus.   Eyes: Negative for pain, discharge and visual disturbance.  Respiratory: Negative for cough and shortness of breath.   Cardiovascular: Negative for chest pain, palpitations and leg swelling.  Gastrointestinal: Positive for nausea. Negative for abdominal distention, abdominal pain, blood in stool, constipation, diarrhea and vomiting.  Genitourinary: Negative for difficulty urinating, dysuria, flank pain, frequency, hematuria, pelvic pain, urgency, vaginal bleeding, vaginal discharge and vaginal pain.  Musculoskeletal: Negative for arthralgias, gait problem and joint swelling.  Skin: Negative for rash.  Neurological: Positive for weakness. Negative for dizziness, syncope, speech difficulty, numbness and headaches.  Hematological: Negative for adenopathy.  Psychiatric/Behavioral: Negative for agitation, behavioral problems and dysphoric mood. The patient is not nervous/anxious.        Objective:   Physical Exam  Constitutional: She is oriented to person, place, and time. She appears well-developed and well-nourished.  Anxious No distress Blood pressure normal Pulse 74 Afebrile  HENT:  Head: Normocephalic.  Right Ear: External ear normal.  Left Ear: External ear normal.  Mouth/Throat: Oropharynx is clear and moist.  Appears well-hydrated  Eyes: Pupils are equal, round, and  reactive to light. Conjunctivae and EOM are normal.  Neck: Normal range of motion. Neck supple. No thyromegaly present.  Cardiovascular: Normal rate, regular rhythm, normal heart sounds and intact distal pulses.   Pulmonary/Chest: Effort normal and breath sounds normal. No respiratory distress. She has no wheezes.  Abdominal: Soft. Bowel sounds are normal. She exhibits no mass. There is no tenderness. There is no rebound and no guarding.  Musculoskeletal: Normal range of motion.  Lymphadenopathy:    She has no cervical adenopathy.  Neurological: She is alert and oriented to person, place, and time.  Skin: Skin is warm and dry. No rash noted.  Psychiatric: She has a normal mood and affect. Her behavior is normal.          Assessment & Plan:  History of Salmonella gastroenteritis Persistent weakness, low-grade fever, anorexia and weight loss  We'll check updated lab.  Patient does have a history of hypokalemia. We'll check blood cultures  [place] with azithromycin 500 mg daily for 7 days  Follow-up 2 weeks or sooner if unimproved  [employer]

## 2017-04-03 NOTE — Patient Instructions (Signed)
Advance diet as tolerated  Return in 2 weeks for follow-up or sooner if unimproved

## 2017-04-03 NOTE — Telephone Encounter (Signed)
Please advise 

## 2017-04-04 ENCOUNTER — Telehealth: Payer: Self-pay | Admitting: Internal Medicine

## 2017-04-04 LAB — CBC WITH DIFFERENTIAL/PLATELET
BASOS ABS: 0 10*3/uL (ref 0.0–0.1)
Basophils Relative: 0.4 % (ref 0.0–3.0)
EOS PCT: 0.4 % (ref 0.0–5.0)
Eosinophils Absolute: 0 10*3/uL (ref 0.0–0.7)
HCT: 45.6 % (ref 36.0–46.0)
HEMOGLOBIN: 15.3 g/dL — AB (ref 12.0–15.0)
Lymphocytes Relative: 27.3 % (ref 12.0–46.0)
Lymphs Abs: 2.4 10*3/uL (ref 0.7–4.0)
MCHC: 33.7 g/dL (ref 30.0–36.0)
MCV: 98.4 fl (ref 78.0–100.0)
MONO ABS: 0.6 10*3/uL (ref 0.1–1.0)
MONOS PCT: 7.1 % (ref 3.0–12.0)
Neutro Abs: 5.6 10*3/uL (ref 1.4–7.7)
Neutrophils Relative %: 64.8 % (ref 43.0–77.0)
Platelets: 272 10*3/uL (ref 150.0–400.0)
RBC: 4.63 Mil/uL (ref 3.87–5.11)
RDW: 11.9 % (ref 11.5–15.5)
WBC: 8.6 10*3/uL (ref 4.0–10.5)

## 2017-04-04 LAB — COMPREHENSIVE METABOLIC PANEL
ALBUMIN: 4.6 g/dL (ref 3.5–5.2)
ALT: 15 U/L (ref 0–35)
AST: 22 U/L (ref 0–37)
Alkaline Phosphatase: 39 U/L (ref 39–117)
BUN: 16 mg/dL (ref 6–23)
CALCIUM: 9.7 mg/dL (ref 8.4–10.5)
CO2: 28 mEq/L (ref 19–32)
Chloride: 101 mEq/L (ref 96–112)
Creatinine, Ser: 0.91 mg/dL (ref 0.40–1.20)
GFR: 64.07 mL/min (ref >60.00)
GLUCOSE: 109 mg/dL — AB (ref 70–99)
Potassium: 4.9 mEq/L (ref 3.5–5.1)
Sodium: 136 mEq/L (ref 135–145)
TOTAL PROTEIN: 6.9 g/dL (ref 6.0–8.3)
Total Bilirubin: 0.7 mg/dL (ref 0.2–1.2)

## 2017-04-04 LAB — TSH: TSH: 3.17 u[IU]/mL (ref 0.35–4.50)

## 2017-04-04 LAB — SEDIMENTATION RATE: SED RATE: 2 mm/h (ref 0–30)

## 2017-04-04 NOTE — Telephone Encounter (Signed)
Please advise 

## 2017-04-04 NOTE — Telephone Encounter (Signed)
All labs normal to date.  Blood cultures still pending

## 2017-04-04 NOTE — Telephone Encounter (Signed)
Pt would like results of labs done yesterday. Please call back.

## 2017-04-05 NOTE — Telephone Encounter (Signed)
Pt picked up copy of labs left in front office for pt to pick up.

## 2017-04-05 NOTE — Telephone Encounter (Signed)
Pt left a sealed envelope for Dr Burnice Logan . Placed in physician front office folder.

## 2017-04-05 NOTE — Telephone Encounter (Signed)
Pt was informed that lab results are normal. Pt requested a copy of results.

## 2017-04-07 ENCOUNTER — Encounter: Payer: Self-pay | Admitting: Cardiovascular Disease

## 2017-04-07 ENCOUNTER — Ambulatory Visit (INDEPENDENT_AMBULATORY_CARE_PROVIDER_SITE_OTHER): Payer: Medicare Other | Admitting: Cardiovascular Disease

## 2017-04-07 VITALS — BP 130/68 | HR 73 | Ht 64.75 in | Wt 124.4 lb

## 2017-04-07 DIAGNOSIS — A029 Salmonella infection, unspecified: Secondary | ICD-10-CM | POA: Insufficient documentation

## 2017-04-07 DIAGNOSIS — I5181 Takotsubo syndrome: Secondary | ICD-10-CM

## 2017-04-07 NOTE — Progress Notes (Signed)
Jocelyn Day Date of Birth  1941-07-13 [place] [address]    [place]   [place], [place]  [address]    [place], [place]  [address] [phone]  Fax  [phone]  [phone]  Fax [phone]  Problem List: 1.  Takosubo Syndrome - Feb, 2012, EF = 30%, echo in April, 2012 revealed EF of 45-50%,   EF 60% in August 2013.    History of Present Illness:  Jocelyn Day is a 76 yo with a hx of Takosubo Syndrome.  She has not had any recurrent chest pain.  Her husband died of esophageal cancer this past December. She is exercising some.    Jan 18, 2013:  She is doing well.    No CP. No dyspnea.    January 20, 2014:  Jocelyn Day is doing well.  Has had some shoulder problems.   February 17, 2015:  Jocelyn Day is doing well. No episodes of CP , no dyspnea , Had some nausea and dizziness during a Tai Chi work out  February 18, 2016:  Jocelyn Day is doing well from a cardiac standp[place]. Has been found to had breast -  DCIS .   Scheduled for lumpectomy.   No plans for chemo or XRT at this point .  Breathing is good .   Aug. 17, 2018:  Seen with daughter, Jocelyn Day  ( nurse anesthetist at [employer])   Has had salmonella poisoning  Had low potassium levels Was started on [place] . Is now eating better Diarrhea has resolved.   Current Outpatient Prescriptions on File Prior to Visit  Medication Sig Dispense Refill  . aspirin 81 MG tablet Take 81 mg by mouth daily.      [name] atorvastatin (LIPITOR) 20 MG tablet TAKE 1 TABLET BY MOUTH  DAILY 90 tablet 1  . azithromycin (ZITHROMAX) 250 MG tablet 2 tablets once daily for 3 consecutive days 14 tablet 0  . carvedilol (COREG) 12.5 MG tablet Take 1 tablet (12.5 mg total) by mouth 2 (two) times daily with a meal. 180 tablet 0  . diphenoxylate-atropine (LOMOTIL) 2.5-0.025 MG tablet Take 1 tablet by mouth 4 (four) times daily as needed for diarrhea or loose stools. 30 tablet 0  . lisinopril (PRINIVIL,ZESTRIL) 5 MG tablet Take 1  tablet (5 mg total) by mouth daily. *Please call and schedule a one year follow up appointment* 90 tablet 0  . tamoxifen (NOLVADEX) 20 MG tablet Take 1 tablet (20 mg total) by mouth daily. 90 tablet 3  . Calcium Carb-Cholecalciferol (CALCIUM PLUS VITAMIN D3 PO) Take by mouth 2 (two) times daily.      . potassium chloride SA (K-DUR,KLOR-CON) 20 MEQ tablet TAKE 1 TABLET(20 MEQ) BY MOUTH TWICE DAILY (Patient not taking: Reported on 04/07/2017) 180 tablet 0   No current facility-administered medications on file prior to visit.     Allergies  Allergen Reactions  . No Known Allergies     Past Medical History:  Diagnosis Date  . ACS (acute coronary syndrome) ([facility]) 10/20/2010   Normal coronaries per cath 2/12  . Atrophic vaginitis   . Breast cancer of upper-outer quadrant of right female breast ([facility]) 01/28/2016  . Cancer ([facility])    skin cancer-basal cell  . Diverticulosis   . History of kidney stones   . Hypertension   . Insomnia   . Myocardial infarction ([facility])    taksubo syndrome  . Osteoporosis 01/2017   T score -2.5  overall stable from prior DEXA  . Takotsubo cardiomyopathy 10/21/2010   Acute coronary syndrome/Takotsubo cardiomyopathy    Past Surgical History:  Procedure Laterality Date  . basal cell carinoma excised  2005   face ,breast rt squamous cell 2016  . BREAST BIOPSY  01/27/2016   Malignant  . BREAST LUMPECTOMY WITH RADIOACTIVE SEED LOCALIZATION Right 03/08/2016   Procedure: RIGHT BREAST LUMPECTOMY WITH RADIOACTIVE SEED LOCALIZATION;  Surgeon: [name], MD;  Location: [facility];  Service: General;  Laterality: Right;  . CARDIAC CATHETERIZATION  10/21/2010   Normal coronary arteries -- Apical ballooning syndrome consistent with tako-tsubo cardiomyopathy with an ejection fraction of 30%  . CESAREAN SECTION  1974  . [place]  . CHOLECYSTECTOMY  1993  . COLONOSCOPY    . DILATION AND CURETTAGE OF UTERUS  1990  . [place]  2010  . POLYPECTOMY       History  Smoking Status  . Never Smoker  Smokeless Tobacco  . Never Used    History  Alcohol Use  . 0.0 oz/week    Comment: once or twice  a month a glasses of wine    Family History  Problem Relation Age of Onset  . Hypertension Mother   . Cancer Father        prostate  . Diabetes Brother        liver translplant and medication induced the diabetes  . Diabetes Paternal Uncle   . Diabetes Cousin   . Cervical cancer Sister   . Heart disease Neg Hx   . Colon cancer Neg Hx     Reviw of Systems:  Reviewed in the HPI.  All other systems are negative.  Physical Exam: Blood pressure 130/68, pulse 73, height 5' 4.75" (1.645 m), weight 124 lb 6.4 oz (56.4 kg), SpO2 97 %. General: Well developed, well nourished, in no acute distress.  Head: Normocephalic, atraumatic, sclera non-icteric, mucus membranes are moist,   Neck: Supple. Negative for carotid bruits. JVD not elevated.  Lungs: Clear bilaterally to auscultation without wheezes, rales, or rhonchi. Breathing is unlabored.  Heart: RRR with S1 S2. No murmurs, rubs, or gallops appreciated.  Abdomen: Soft, non-tender, non-distended with normoactive bowel sounds. No hepatomegaly. No rebound/guarding. No obvious abdominal masses.  Msk:  Strength and tone appear normal for age.  Extremities: No clubbing or cyanosis. No edema.  Distal pedal pulses are 2+ and equal bilaterally.  Neuro: Alert and oriented X 3. Moves all extremities spontaneously.  Psych:  Responds to questions appropriately with a normal affect.  ECG: Apr 07, 2017:    NSR at 20 .   No ST or T wave changes   Assessment / Plan:   1.  Takosubo Syndrome - Feb, 2012, EF = 30%, echo in April, 2012 revealed EF of 45-50%,  EF is now 60% by echo She is completely asymptomatic from a cardiac standpoint .  Her last EF was normal.   2.  Hypokalemia:    K is now normal  She has already stopped the [place].   Will take if off her list and will recheck a BMP in 1-2  weeks     She is doing well .    Continue same meds.  I will see her in 1 year   Jocelyn Moores, MD  04/07/2017 4:24 PM    [place],  [place], [place]  [address] Pager [phone] Phone: [phone]; Fax: [phone]

## 2017-04-07 NOTE — Patient Instructions (Addendum)
Medication Instructions:  STOP Kdur (potassium)   Labwork: Your physician recommends that you return for lab work on: Monday August 27 - you may come in anytime between 8:00 am and 4:30 pm - you do not have to fast   Testing/Procedures: None Ordered   Follow-Up: Your physician wants you to follow-up in: 1 year with Dr. Acie Fredrickson.  You will receive a reminder letter in the mail two months in advance. If you don't receive a letter, please call our office to schedule the follow-up appointment.   If you need a refill on your cardiac medications before your next appointment, please call your pharmacy.   Thank you for choosing [facility]! Christen Bame, RN [phone]

## 2017-04-09 LAB — CULTURE, BLOOD (SINGLE): ORGANISM ID, BACTERIA: NO GROWTH

## 2017-04-12 ENCOUNTER — Ambulatory Visit (INDEPENDENT_AMBULATORY_CARE_PROVIDER_SITE_OTHER): Payer: Medicare Other | Admitting: Gynecology

## 2017-04-12 ENCOUNTER — Encounter: Payer: Self-pay | Admitting: Gynecology

## 2017-04-12 VITALS — BP 120/72 | Ht 66.0 in | Wt 125.0 lb

## 2017-04-12 DIAGNOSIS — M810 Age-related osteoporosis without current pathological fracture: Secondary | ICD-10-CM | POA: Diagnosis not present

## 2017-04-12 DIAGNOSIS — Z01411 Encounter for gynecological examination (general) (routine) with abnormal findings: Secondary | ICD-10-CM | POA: Diagnosis not present

## 2017-04-12 DIAGNOSIS — N952 Postmenopausal atrophic vaginitis: Secondary | ICD-10-CM | POA: Diagnosis not present

## 2017-04-12 NOTE — Progress Notes (Signed)
    Jocelyn Day 31-Jan-1941 [id number]        76 y.o.  G2P2002 for annual exam.   Past medical history,surgical history, problem list, medications, allergies, family history and social history were all reviewed and documented as reviewed in the EPIC chart.  ROS:  Performed with pertinent positives and negatives included in the history, assessment and plan.   Additional significant findings :  None   Exam: [name] assistant Vitals:   04/12/17 1107  BP: 120/72  Weight: 125 lb (56.7 kg)  Height: 5\' 6"  (1.676 m)   Body mass index is 20.18 kg/m.  General appearance:  Normal affect, orientation and appearance. Skin: Grossly normal HEENT: Without gross lesions.  No cervical or supraclavicular adenopathy. Thyroid normal.  Lungs:  Clear without wheezing, rales or rhonchi Cardiac: RR, without RMG Abdominal:  Soft, nontender, without masses, guarding, rebound, organomegaly or hernia Breasts:  Examined lying and sitting without masses, retractions, discharge or axillary adenopathy.  Well-healed right lumpectomy scar. Pelvic:  Ext, BUS, Vagina: With atrophic changes  Cervix: With atrophic changes  Uterus: Anteverted, normal size, shape and contour, midline and mobile nontender   Adnexa: Without masses or tenderness    Anus and perineum: Normal   Rectovaginal: Normal sphincter tone without palpated masses or tenderness.    Assessment/Plan:  76 y.o. G29P2002 female for annual exam.   1. Postmenopausal/atrophic genital changes. No significant hot flushes, night sweats, vaginal dryness or any vaginal bleeding. Continue to monitor report any issues or bleeding. 2. Osteoporosis.  DEXA 01/2017 T score -2.5 overall stable from prior DEXA.  I reviewed her increased risk of fracture based on her study. Started tamoxifen last year for her breast cancer. We discussed benefits of tamoxifen as far as bone health. Also reviewed whether to add additional treatments such as alendronate or other  osteoporotic medications. After a lengthy discussion the patient is comfortable continuing her tamoxifen with no additional treatment. She is supplementing vitamin D and calcium daily. She is active and walks. Has no issues with falling. We'll plan on follow up bone density a 2 year interval and then go from there. 3. History of right breast cancer. Exam NED. Mammography 01/2017. Currently on tamoxifen. Continue to follow up with her oncologist. Report any vaginal bleeding. 4. Pap smear 2012. No history of abnormal Pap smears previously. No Pap smear done today. We both agree to stop screening per current screening guidelines based on age. 5. Colonoscopy 2014. Repeat at their recommended interval. 6. Health maintenance. No routine lab work done as patient does this elsewhere. Follow up in one year, sooner as needed.   Anastasio Auerbach MD, 11:56 AM 04/12/2017

## 2017-04-12 NOTE — Patient Instructions (Signed)
Follow up in one year for annual exam 

## 2017-04-17 ENCOUNTER — Other Ambulatory Visit: Payer: Medicare Other | Admitting: *Deleted

## 2017-04-17 DIAGNOSIS — I5181 Takotsubo syndrome: Secondary | ICD-10-CM | POA: Diagnosis not present

## 2017-04-17 DIAGNOSIS — A029 Salmonella infection, unspecified: Secondary | ICD-10-CM

## 2017-04-17 LAB — BASIC METABOLIC PANEL
BUN / CREAT RATIO: 13 (ref 12–28)
BUN: 13 mg/dL (ref 8–27)
CO2: 26 mmol/L (ref 20–29)
CREATININE: 0.97 mg/dL (ref 0.57–1.00)
Calcium: 9.2 mg/dL (ref 8.7–10.3)
Chloride: 101 mmol/L (ref 96–106)
GFR, EST AFRICAN AMERICAN: 67 mL/min/{1.73_m2} (ref >59)
GFR, EST NON AFRICAN AMERICAN: 58 mL/min/{1.73_m2} — AB (ref >59)
Glucose: 129 mg/dL — ABNORMAL HIGH (ref 65–99)
Potassium: 4.4 mmol/L (ref 3.5–5.2)
SODIUM: 140 mmol/L (ref 134–144)

## 2017-04-19 ENCOUNTER — Other Ambulatory Visit: Payer: Self-pay | Admitting: Hematology and Oncology

## 2017-04-19 ENCOUNTER — Other Ambulatory Visit: Payer: Self-pay | Admitting: Cardiovascular Disease

## 2017-04-20 ENCOUNTER — Telehealth: Payer: Self-pay | Admitting: Cardiovascular Disease

## 2017-04-20 NOTE — Telephone Encounter (Signed)
Pt is aware of lab results. Pt verbalized understanding. 

## 2017-04-20 NOTE — Telephone Encounter (Signed)
Patient calling in regards to lab results. She would like to know if they are available.

## 2017-05-11 ENCOUNTER — Encounter: Payer: Self-pay | Admitting: Internal Medicine

## 2017-06-06 DIAGNOSIS — H2513 Age-related nuclear cataract, bilateral: Secondary | ICD-10-CM | POA: Diagnosis not present

## 2017-06-06 DIAGNOSIS — H5213 Myopia, bilateral: Secondary | ICD-10-CM | POA: Diagnosis not present

## 2017-06-14 ENCOUNTER — Ambulatory Visit (INDEPENDENT_AMBULATORY_CARE_PROVIDER_SITE_OTHER): Payer: Medicare Other | Admitting: Podiatry

## 2017-06-14 ENCOUNTER — Encounter: Payer: Self-pay | Admitting: Podiatry

## 2017-06-14 ENCOUNTER — Ambulatory Visit (INDEPENDENT_AMBULATORY_CARE_PROVIDER_SITE_OTHER): Payer: Medicare Other

## 2017-06-14 DIAGNOSIS — M21619 Bunion of unspecified foot: Secondary | ICD-10-CM | POA: Diagnosis not present

## 2017-06-14 DIAGNOSIS — M205X1 Other deformities of toe(s) (acquired), right foot: Secondary | ICD-10-CM

## 2017-06-17 NOTE — Progress Notes (Signed)
   HPI: 76 year old female presenting today with a new complaint of pain to the medial side of the right foot. She is concerned for a possible bunion. Wearing certain shoes increases the pain. She has not done anything to treat her symptoms. She is here for further evaluation and treatment.   Past Medical History:  Diagnosis Date  . ACS (acute coronary syndrome) ([place]) 10/20/2010   Normal coronaries per cath 2/12  . Atrophic vaginitis   . Breast cancer of upper-outer quadrant of right female breast ([place]) 01/28/2016  . Cancer ([place])    skin cancer-basal cell  . Diverticulosis   . History of kidney stones   . Hypertension   . Insomnia   . Myocardial infarction ([place])    taksubo syndrome  . Osteoporosis 01/2017   T score -2.5 overall stable from prior DEXA  . Salmonella   . Takotsubo cardiomyopathy 10/21/2010   Acute coronary syndrome/Takotsubo cardiomyopathy     Physical Exam: General: The patient is alert and oriented x3 in no acute distress.  Dermatology: Skin is warm, dry and supple bilateral lower extremities. Negative for open lesions or macerations.  Vascular: Palpable pedal pulses bilaterally. No edema or erythema noted. Capillary refill within normal limits.  Neurological: Epicritic and protective threshold grossly intact bilaterally.   Musculoskeletal: Pain on palpation with limited range of motion noted to the first MPJ right foot.  Radiographic Exam:  Normal osseous mineralization. Joint spaces preserved. No fracture/dislocation/boney destruction. Spurring noted to head of the first metatarsal consistent with hallux limitus.  Assessment: - Hallux limitus right   Plan of Care:  - Patient evaluated. X-rays reviewed. - Currently nonsymptomatic. - Recommended good shoe gear. - Return to clinic when necessary for surgical consult.   Edrick Kins, DPM [facility]  Dr. Edrick Kins, DPM    [address], [place] [address]                Office [phone]  Fax [phone]

## 2017-06-21 ENCOUNTER — Other Ambulatory Visit: Payer: Self-pay | Admitting: Cardiovascular Disease

## 2017-06-21 ENCOUNTER — Other Ambulatory Visit: Payer: Self-pay | Admitting: Internal Medicine

## 2017-07-25 ENCOUNTER — Ambulatory Visit (INDEPENDENT_AMBULATORY_CARE_PROVIDER_SITE_OTHER): Payer: Medicare Other | Admitting: Internal Medicine

## 2017-07-25 ENCOUNTER — Encounter: Payer: Self-pay | Admitting: Internal Medicine

## 2017-07-25 VITALS — BP 110/64 | HR 74 | Temp 98.0°F | Ht 66.0 in | Wt 133.4 lb

## 2017-07-25 DIAGNOSIS — Z0000 Encounter for general adult medical examination without abnormal findings: Secondary | ICD-10-CM

## 2017-07-25 DIAGNOSIS — E785 Hyperlipidemia, unspecified: Secondary | ICD-10-CM

## 2017-07-25 DIAGNOSIS — I10 Essential (primary) hypertension: Secondary | ICD-10-CM

## 2017-07-25 NOTE — Progress Notes (Signed)
Subjective:    Patient ID: Jocelyn Day, female    DOB: 08/10/1941, 76 y.o.   MRN: [id number]  HPI  76 year old patient who is seen today for a annual examination as well as subsequent Medicare wellness visit.  She is followed by cardiology and clinically has done quite well.  She remains quite active with trips to her local gymnasium 3 times per week.  No cardiopulmonary complaints  . Past Medical History:  Diagnosis Date  . ACS (acute coronary syndrome) ([place]) 10/20/2010   Normal coronaries per cath 2/12  . Atrophic vaginitis   . Breast cancer of upper-outer quadrant of right female breast ([place]) 01/28/2016  . Cancer ([place])    skin cancer-basal cell  . Diverticulosis   . History of kidney stones   . Hypertension   . Insomnia   . Myocardial infarction ([place])    taksubo syndrome  . Osteoporosis 01/2017   T score -2.5 overall stable from prior DEXA  . Salmonella   . Takotsubo cardiomyopathy 10/21/2010   Acute coronary syndrome/Takotsubo cardiomyopathy     Social History   Socioeconomic History  . Marital status: Married    Spouse name: Not on file  . Number of children: 2  . Years of education: Not on file  . Highest education level: Not on file  Social Needs  . Financial resource strain: Not on file  . Food insecurity - worry: Not on file  . Food insecurity - inability: Not on file  . Transportation needs - medical: Not on file  . Transportation needs - non-medical: Not on file  Occupational History  . Not on file  Tobacco Use  . Smoking status: Never Smoker  . Smokeless tobacco: Never Used  Substance and Sexual Activity  . Alcohol use: Yes    Alcohol/week: 0.0 oz    Comment: once or twice  a month a glasses of wine  . Drug use: No  . Sexual activity: Not Currently    Birth control/protection: Post-menopausal    Comment: 1st intercourse 36 yo-1 partner  Other Topics Concern  . Not on file  Social History Narrative  . Not on file    Past Surgical History:    Procedure Laterality Date  . basal cell carinoma excised  2005   face ,breast rt squamous cell 2016  . BREAST BIOPSY  01/27/2016   Malignant  . BREAST LUMPECTOMY WITH RADIOACTIVE SEED LOCALIZATION Right 03/08/2016   Procedure: RIGHT BREAST LUMPECTOMY WITH RADIOACTIVE SEED LOCALIZATION;  Surgeon: [name], MD;  Location: [facility];  Service: General;  Laterality: Right;  . CARDIAC CATHETERIZATION  10/21/2010   Normal coronary arteries -- Apical ballooning syndrome consistent with tako-tsubo cardiomyopathy with an ejection fraction of 30%  . CESAREAN SECTION  1974  . [place]  . CHOLECYSTECTOMY  1993  . COLONOSCOPY    . DILATION AND CURETTAGE OF UTERUS  1990  . [place]  2010  . POLYPECTOMY      Family History  Problem Relation Age of Onset  . Hypertension Mother   . Cancer Father        prostate  . Diabetes Brother        liver translplant and medication induced the diabetes  . Diabetes Paternal Uncle   . Diabetes Cousin   . Cervical cancer Sister   . Heart disease Neg Hx   . Colon cancer Neg Hx     Allergies  Allergen Reactions  . No Known Allergies  Current Outpatient Medications on File Prior to Visit  Medication Sig Dispense Refill  . aspirin 81 MG tablet Take 81 mg by mouth daily.      [name] atorvastatin (LIPITOR) 20 MG tablet TAKE 1 TABLET BY MOUTH  DAILY 90 tablet 3  . Calcium Carb-Cholecalciferol (CALCIUM PLUS VITAMIN D3 PO) Take by mouth 2 (two) times daily.      . carvedilol (COREG) 12.5 MG tablet TAKE 1 TABLET BY MOUTH TWO  TIMES DAILY WITH MEALS 180 tablet 2  . DiphenhydrAMINE HCl (BENADRYL PO) Take by mouth.    [name] lisinopril (PRINIVIL,ZESTRIL) 5 MG tablet Take 1 tablet (5 mg total) by mouth daily. 90 tablet 3  . tamoxifen (NOLVADEX) 20 MG tablet TAKE 1 TABLET BY MOUTH  DAILY 90 tablet 3   No current facility-administered medications on file prior to visit.     BP 110/64 (BP Location: Left Arm, Patient Position: Sitting, Cuff Size:  Normal)   Pulse 74   Temp 98 F (36.7 C) (Oral)   Ht 5\' 6"  (1.676 m)   Wt 133 lb 6.4 oz (60.5 kg)   BMI 21.53 kg/m   Subsequent Medicare wellness visit  1. Risk factors, based on past  M,S,F history.  Cardiovascular risk factors include a history of hypertension dyslipidemia.  2.  Physical activities: Remains quite active.  Does go to the gym 3 times per week.  No exercise limitations  3.  Depression/mood: No history of major depression or mood disorder  4.  Hearing: No deficits  5.  ADL's: Independent  6.  Fall risk: Low  7.  Home safety: No problems identified  8.  Height weight, and visual acuity; height and weight stable no major change in visual acuity.  She is followed by ophthalmology who feels that she may need cataract extraction surgery in the past.  Presently vision is good with contacts  9.  Counseling: Continue heart healthy diet and active lifestyle  10. Lab orders based on risk factors: Laboratory update will be reviewed including lipid panel  11. Referral : Follow-up cardiology  12. Care plan: Continue efforts at aggressive risk factor modification  13. Cognitive assessment: Alert and oriented with normal affect no cognitive dysfunction  14. Screening: Patient provided with a written and personalized 5-10 year screening schedule in the AVS.    15. Provider List Update: Primary care cardiology dermatology gynecology hematology and ophthalmology    Review of Systems  Constitutional: Negative.   HENT: Negative for congestion, dental problem, hearing loss, rhinorrhea, sinus pressure, sore throat and tinnitus.   Eyes: Negative for pain, discharge and visual disturbance.  Respiratory: Negative for cough and shortness of breath.   Cardiovascular: Negative for chest pain, palpitations and leg swelling.  Gastrointestinal: Negative for abdominal distention, abdominal pain, blood in stool, constipation, diarrhea, nausea and vomiting.  Genitourinary: Negative for  difficulty urinating, dysuria, flank pain, frequency, hematuria, pelvic pain, urgency, vaginal bleeding, vaginal discharge and vaginal pain.  Musculoskeletal: Negative for arthralgias, gait problem and joint swelling.  Skin: Negative for rash.  Neurological: Negative for dizziness, syncope, speech difficulty, weakness, numbness and headaches.  Hematological: Negative for adenopathy.  Psychiatric/Behavioral: Negative for agitation, behavioral problems and dysphoric mood. The patient is not nervous/anxious.        Objective:   Physical Exam  Constitutional: She is oriented to person, place, and time. She appears well-developed and well-nourished.  HENT:  Head: Normocephalic and atraumatic.  Right Ear: External ear normal.  Left Ear: External ear normal.  Mouth/Throat: Oropharynx is clear and moist.  Eyes: Conjunctivae and EOM are normal.  Neck: Normal range of motion. Neck supple. No JVD present. No thyromegaly present.  Cardiovascular: Normal rate, regular rhythm, normal heart sounds and intact distal pulses.  No murmur heard. Pulmonary/Chest: Effort normal and breath sounds normal. She has no wheezes. She has no rales.  Abdominal: Soft. Bowel sounds are normal. She exhibits no distension and no mass. There is no tenderness. There is no rebound and no guarding.  Musculoskeletal: Normal range of motion. She exhibits no edema or tenderness.  Neurological: She is alert and oriented to person, place, and time. She has normal reflexes. No cranial nerve deficit. She exhibits normal muscle tone. Coordination normal.  Skin: Skin is warm and dry. No rash noted.  Psychiatric: She has a normal mood and affect. Her behavior is normal.          Assessment & Plan:   Preventive health examination Subsequent Medicare wellness visit Essential hypertension well-controlled Dyslipidemia continue statin therapy.  Will review a lipid profile  Review lab Follow-up one year  Nyoka Cowden

## 2017-07-25 NOTE — Patient Instructions (Addendum)
Limit your sodium (Salt) intake    It is important that you exercise regularly, at least 20 minutes 3 to 4 times per week.  If you develop chest pain or shortness of breath seek  medical attention.  Return in one year for follow-up   Health Maintenance for Postmenopausal Women Menopause is a normal process in which your reproductive ability comes to an end. This process happens gradually over a span of months to years, usually between the ages of 64 and 35. Menopause is complete when you have missed 12 consecutive menstrual periods. It is important to talk with your health care provider about some of the most common conditions that affect postmenopausal women, such as heart disease, cancer, and bone loss (osteoporosis). Adopting a healthy lifestyle and getting preventive care can help to promote your health and wellness. Those actions can also lower your chances of developing some of these common conditions. What should I know about menopause? During menopause, you may experience a number of symptoms, such as:  Moderate-to-severe hot flashes.  Night sweats.  Decrease in sex drive.  Mood swings.  Headaches.  Tiredness.  Irritability.  Memory problems.  Insomnia.  Choosing to treat or not to treat menopausal changes is an individual decision that you make with your health care provider. What should I know about hormone replacement therapy and supplements? Hormone therapy products are effective for treating symptoms that are associated with menopause, such as hot flashes and night sweats. Hormone replacement carries certain risks, especially as you become older. If you are thinking about using estrogen or estrogen with progestin treatments, discuss the benefits and risks with your health care provider. What should I know about heart disease and stroke? Heart disease, heart attack, and stroke become more likely as you age. This may be due, in part, to the hormonal changes that your  body experiences during menopause. These can affect how your body processes dietary fats, triglycerides, and cholesterol. Heart attack and stroke are both medical emergencies. There are many things that you can do to help prevent heart disease and stroke:  Have your blood pressure checked at least every 1-2 years. High blood pressure causes heart disease and increases the risk of stroke.  If you are 71-34 years old, ask your health care provider if you should take aspirin to prevent a heart attack or a stroke.  Do not use any tobacco products, including cigarettes, chewing tobacco, or electronic cigarettes. If you need help quitting, ask your health care provider.  It is important to eat a healthy diet and maintain a healthy weight. ? Be sure to include plenty of vegetables, fruits, low-fat dairy products, and lean protein. ? Avoid eating foods that are high in solid fats, added sugars, or salt (sodium).  Get regular exercise. This is one of the most important things that you can do for your health. ? Try to exercise for at least 150 minutes each week. The type of exercise that you do should increase your heart rate and make you sweat. This is known as moderate-intensity exercise. ? Try to do strengthening exercises at least twice each week. Do these in addition to the moderate-intensity exercise.  Know your numbers.Ask your health care provider to check your cholesterol and your blood glucose. Continue to have your blood tested as directed by your health care provider.  What should I know about cancer screening? There are several types of cancer. Take the following steps to reduce your risk and to catch any cancer  development as early as possible. Breast Cancer  Practice breast self-awareness. ? This means understanding how your breasts normally appear and feel. ? It also means doing regular breast self-exams. Let your health care provider know about any changes, no matter how small.  If  you are 1 or older, have a clinician do a breast exam (clinical breast exam or CBE) every year. Depending on your age, family history, and medical history, it may be recommended that you also have a yearly breast X-ray (mammogram).  If you have a family history of breast cancer, talk with your health care provider about genetic screening.  If you are at high risk for breast cancer, talk with your health care provider about having an MRI and a mammogram every year.  Breast cancer (BRCA) gene test is recommended for women who have family members with BRCA-related cancers. Results of the assessment will determine the need for genetic counseling and BRCA1 and for BRCA2 testing. BRCA-related cancers include these types: ? Breast. This occurs in males or females. ? Ovarian. ? Tubal. This may also be called fallopian tube cancer. ? Cancer of the abdominal or pelvic lining (peritoneal cancer). ? Prostate. ? Pancreatic.  Cervical, Uterine, and Ovarian Cancer Your health care provider may recommend that you be screened regularly for cancer of the pelvic organs. These include your ovaries, uterus, and vagina. This screening involves a pelvic exam, which includes checking for microscopic changes to the surface of your cervix (Pap test).  For women ages 21-65, health care providers may recommend a pelvic exam and a Pap test every three years. For women ages 4-65, they may recommend the Pap test and pelvic exam, combined with testing for human papilloma virus (HPV), every five years. Some types of HPV increase your risk of cervical cancer. Testing for HPV may also be done on women of any age who have unclear Pap test results.  Other health care providers may not recommend any screening for nonpregnant women who are considered low risk for pelvic cancer and have no symptoms. Ask your health care provider if a screening pelvic exam is right for you.  If you have had past treatment for cervical cancer or a  condition that could lead to cancer, you need Pap tests and screening for cancer for at least 20 years after your treatment. If Pap tests have been discontinued for you, your risk factors (such as having a new sexual partner) need to be reassessed to determine if you should start having screenings again. Some women have medical problems that increase the chance of getting cervical cancer. In these cases, your health care provider may recommend that you have screening and Pap tests more often.  If you have a family history of uterine cancer or ovarian cancer, talk with your health care provider about genetic screening.  If you have vaginal bleeding after reaching menopause, tell your health care provider.  There are currently no reliable tests available to screen for ovarian cancer.  Lung Cancer Lung cancer screening is recommended for adults 59-63 years old who are at high risk for lung cancer because of a history of smoking. A yearly low-dose CT scan of the lungs is recommended if you:  Currently smoke.  Have a history of at least 30 pack-years of smoking and you currently smoke or have quit within the past 15 years. A pack-year is smoking an average of one pack of cigarettes per day for one year.  Yearly screening should:  Continue until it  has been 15 years since you quit.  Stop if you develop a health problem that would prevent you from having lung cancer treatment.  Colorectal Cancer  This type of cancer can be detected and can often be prevented.  Routine colorectal cancer screening usually begins at age 50 and continues through age 75.  If you have risk factors for colon cancer, your health care provider may recommend that you be screened at an earlier age.  If you have a family history of colorectal cancer, talk with your health care provider about genetic screening.  Your health care provider may also recommend using home test kits to check for hidden blood in your stool.  A  small camera at the end of a tube can be used to examine your colon directly (sigmoidoscopy or colonoscopy). This is done to check for the earliest forms of colorectal cancer.  Direct examination of the colon should be repeated every 5-10 years until age 75. However, if early forms of precancerous polyps or small growths are found or if you have a family history or genetic risk for colorectal cancer, you may need to be screened more often.  Skin Cancer  Check your skin from head to toe regularly.  Monitor any moles. Be sure to tell your health care provider: ? About any new moles or changes in moles, especially if there is a change in a mole's shape or color. ? If you have a mole that is larger than the size of a pencil eraser.  If any of your family members has a history of skin cancer, especially at a young age, talk with your health care provider about genetic screening.  Always use sunscreen. Apply sunscreen liberally and repeatedly throughout the day.  Whenever you are outside, protect yourself by wearing long sleeves, pants, a wide-brimmed hat, and sunglasses.  What should I know about osteoporosis? Osteoporosis is a condition in which bone destruction happens more quickly than new bone creation. After menopause, you may be at an increased risk for osteoporosis. To help prevent osteoporosis or the bone fractures that can happen because of osteoporosis, the following is recommended:  If you are 19-50 years old, get at least 1,000 mg of calcium and at least 600 mg of vitamin D per day.  If you are older than age 50 but younger than age 70, get at least 1,200 mg of calcium and at least 600 mg of vitamin D per day.  If you are older than age 70, get at least 1,200 mg of calcium and at least 800 mg of vitamin D per day.  Smoking and excessive alcohol intake increase the risk of osteoporosis. Eat foods that are rich in calcium and vitamin D, and do weight-bearing exercises several times  each week as directed by your health care provider. What should I know about how menopause affects my mental health? Depression may occur at any age, but it is more common as you become older. Common symptoms of depression include:  Low or sad mood.  Changes in sleep patterns.  Changes in appetite or eating patterns.  Feeling an overall lack of motivation or enjoyment of activities that you previously enjoyed.  Frequent crying spells.  Talk with your health care provider if you think that you are experiencing depression. What should I know about immunizations? It is important that you get and maintain your immunizations. These include:  Tetanus, diphtheria, and pertussis (Tdap) booster vaccine.  Influenza every year before the flu season begins.    Pneumonia vaccine.  Shingles vaccine.  Your health care provider may also recommend other immunizations. This information is not intended to replace advice given to you by your health care provider. Make sure you discuss any questions you have with your health care provider. Document Released: 09/30/2005 Document Revised: 02/26/2016 Document Reviewed: 05/12/2015 Elsevier Interactive Patient Education  2018 [employer]  

## 2017-08-07 DIAGNOSIS — R921 Mammographic calcification found on diagnostic imaging of breast: Secondary | ICD-10-CM | POA: Diagnosis not present

## 2017-08-07 DIAGNOSIS — Z853 Personal history of malignant neoplasm of breast: Secondary | ICD-10-CM | POA: Diagnosis not present

## 2017-08-09 DIAGNOSIS — D2272 Melanocytic nevi of left lower limb, including hip: Secondary | ICD-10-CM | POA: Diagnosis not present

## 2017-08-09 DIAGNOSIS — L820 Inflamed seborrheic keratosis: Secondary | ICD-10-CM | POA: Diagnosis not present

## 2017-08-09 DIAGNOSIS — L570 Actinic keratosis: Secondary | ICD-10-CM | POA: Diagnosis not present

## 2017-08-09 DIAGNOSIS — L821 Other seborrheic keratosis: Secondary | ICD-10-CM | POA: Diagnosis not present

## 2017-08-09 DIAGNOSIS — Z85828 Personal history of other malignant neoplasm of skin: Secondary | ICD-10-CM | POA: Diagnosis not present

## 2018-01-03 ENCOUNTER — Ambulatory Visit: Payer: Medicare Other | Admitting: Hematology and Oncology

## 2018-01-05 ENCOUNTER — Inpatient Hospital Stay: Payer: Medicare Other | Attending: Hematology and Oncology | Admitting: Hematology and Oncology

## 2018-01-05 ENCOUNTER — Telehealth: Payer: Self-pay | Admitting: Hematology and Oncology

## 2018-01-05 DIAGNOSIS — Z79899 Other long term (current) drug therapy: Secondary | ICD-10-CM

## 2018-01-05 DIAGNOSIS — Z79810 Long term (current) use of selective estrogen receptor modulators (SERMs): Secondary | ICD-10-CM | POA: Diagnosis not present

## 2018-01-05 DIAGNOSIS — C50411 Malignant neoplasm of upper-outer quadrant of right female breast: Secondary | ICD-10-CM

## 2018-01-05 DIAGNOSIS — D0511 Intraductal carcinoma in situ of right breast: Secondary | ICD-10-CM

## 2018-01-05 DIAGNOSIS — Z7982 Long term (current) use of aspirin: Secondary | ICD-10-CM

## 2018-01-05 DIAGNOSIS — Z170 Estrogen receptor positive status [ER+]: Secondary | ICD-10-CM

## 2018-01-05 MED ORDER — TAMOXIFEN CITRATE 20 MG PO TABS: 20.0000 mg | ORAL_TABLET | Freq: Every day | ORAL | 3 refills | Status: DC

## 2018-01-05 NOTE — Telephone Encounter (Signed)
Gave patient AVs and calendar of upcoming may 2020 appointments °

## 2018-01-05 NOTE — Progress Notes (Signed)
Patient Care Team: Marletta Lor, MD as PCP - General (Internal Medicine) [name], MD as Consulting Physician (General Surgery) Nicholas Lose, MD as Consulting Physician (Hematology and Oncology) Gery Pray, MD as Consulting Physician (Radiation Oncology)  DIAGNOSIS:  Encounter Diagnosis  Name Primary?  . Malignant neoplasm of upper-outer quadrant of right breast in female, estrogen receptor positive ([place])     SUMMARY OF ONCOLOGIC HISTORY:   Breast cancer of upper-outer quadrant of right female breast ([place])   01/27/2016 Initial Diagnosis    Right breast biopsy: DCIS with calcifications low to intermediate grade, ER 100%, PR 80%, Tis N0 stage 0; screening mammogram revealed right breast calcifications 8 mm      03/08/2016 Surgery    Right lumpectomy (ingram): DCIS intermediate grade with calcifications, 0.9 cm, margins negative, ER 100%, PR 80%, Tis NX stage 0       Radiation Therapy    Refused XRT      06/22/2016 -  Anti-estrogen oral therapy    Tamoxifen 20mg  daily x 5 years       CHIEF COMPLIANT: Follow-up on tamoxifen therapy  INTERVAL HISTORY: Jocelyn Day is a 77 year old with above-mentioned history of right breast DCIS treated with lumpectomy and is currently on antiestrogen therapy with tamoxifen.  She appears to be tolerating tamoxifen extremely well.  She does not have any hot flashes or myalgias.  Denies any lumps or nodules in the breast.  REVIEW OF SYSTEMS:   Constitutional: Denies fevers, chills or abnormal weight loss Eyes: Denies blurriness of vision Ears, nose, mouth, throat, and face: Denies mucositis or sore throat Respiratory: Denies cough, dyspnea or wheezes Cardiovascular: Denies palpitation, chest discomfort Gastrointestinal:  Denies nausea, heartburn or change in bowel habits Skin: Denies abnormal skin rashes Lymphatics: Denies new lymphadenopathy or easy bruising Neurological:Denies numbness, tingling or new  weaknesses Behavioral/Psych: Mood is stable, no new changes  Extremities: No lower extremity edema Breast:  denies any pain or lumps or nodules in either breasts All other systems were reviewed with the patient and are negative.  I have reviewed the past medical history, past surgical history, social history and family history with the patient and they are unchanged from previous note.  ALLERGIES:  is allergic to no known allergies.  MEDICATIONS:  Current Outpatient Medications  Medication Sig Dispense Refill  . aspirin 81 MG tablet Take 81 mg by mouth daily.      [name] atorvastatin (LIPITOR) 20 MG tablet TAKE 1 TABLET BY MOUTH  DAILY 90 tablet 3  . Calcium Carb-Cholecalciferol (CALCIUM PLUS VITAMIN D3 PO) Take by mouth 2 (two) times daily.      . carvedilol (COREG) 12.5 MG tablet TAKE 1 TABLET BY MOUTH TWO  TIMES DAILY WITH MEALS 180 tablet 2  . DiphenhydrAMINE HCl (BENADRYL PO) Take by mouth.    [name] lisinopril (PRINIVIL,ZESTRIL) 5 MG tablet Take 1 tablet (5 mg total) by mouth daily. 90 tablet 3  . tamoxifen (NOLVADEX) 20 MG tablet TAKE 1 TABLET BY MOUTH  DAILY 90 tablet 3   No current facility-administered medications for this visit.     PHYSICAL EXAMINATION: ECOG PERFORMANCE STATUS: 1 - Symptomatic but completely ambulatory  Vitals:   01/05/18 1040  BP: (!) 137/59  Pulse: (!) 59  Resp: 17  Temp: 98.5 F (36.9 C)  SpO2: 99%   Filed Weights   01/05/18 1040  Weight: 133 lb 9.6 oz (60.6 kg)    GENERAL:alert, no distress and comfortable SKIN: skin color, texture, turgor are normal,  no rashes or significant lesions EYES: normal, Conjunctiva are pink and non-injected, sclera clear OROPHARYNX:no exudate, no erythema and lips, buccal mucosa, and tongue normal  NECK: supple, thyroid normal size, non-tender, without nodularity LYMPH:  no palpable lymphadenopathy in the cervical, axillary or inguinal LUNGS: clear to auscultation and percussion with normal breathing effort HEART:  regular rate & rhythm and no murmurs and no lower extremity edema ABDOMEN:abdomen soft, non-tender and normal bowel sounds MUSCULOSKELETAL:no cyanosis of digits and no clubbing  NEURO: alert & oriented x 3 with fluent speech, no focal motor/sensory deficits EXTREMITIES: No lower extremity edema BREAST: No palpable masses or nodules in either right or left breasts. No palpable axillary supraclavicular or infraclavicular adenopathy no breast tenderness or nipple discharge. (exam performed in the presence of a chaperone)  LABORATORY DATA:  I have reviewed the data as listed CMP Latest Ref Rng & Units 04/17/2017 04/03/2017 03/21/2017  Glucose 65 - 99 mg/dL 129(H) 109(H) 150(H)  BUN 8 - 27 mg/dL 13 16 8   Creatinine 0.57 - 1.00 mg/dL 0.97 0.91 0.79  Sodium 134 - 144 mmol/L 140 136 138  Potassium 3.5 - 5.2 mmol/L 4.4 4.9 3.1(L)  Chloride 96 - 106 mmol/L 101 101 102  CO2 20 - 29 mmol/L 26 28 29   Calcium 8.7 - 10.3 mg/dL 9.2 9.7 8.5  Total Protein 6.0 - 8.3 g/dL - 6.9 6.1  Total Bilirubin 0.2 - 1.2 mg/dL - 0.7 0.4  Alkaline Phos 39 - 117 U/L - 39 32(L)  AST 0 - 37 U/L - 22 40(H)  ALT 0 - 35 U/L - 15 30    Lab Results  Component Value Date   WBC 8.6 04/03/2017   HGB 15.3 (H) 04/03/2017   HCT 45.6 04/03/2017   MCV 98.4 04/03/2017   PLT 272.0 04/03/2017   NEUTROABS 5.6 04/03/2017    ASSESSMENT & PLAN:  Breast cancer of upper-outer quadrant of right female breast ([place]) Right lumpectomy 03/08/2016: DCIS intermediate grade with calcifications, 0.9 cm, margins negative, ER 100%, PR 80%, Tis NX stage 0 Refused adj XRT  Treatment Plan: Tamoxifen 20 mg daily x 5 years started 06/22/2016  Tamoxifen Toxicities: 1. Occasional hot flashes Denies any myalgias  Breast cancer surveillance: 1.  Breast exam 01/05/2018: Benign  2. mammogram 02/07/2017: Benign  Return to clinic in 1 year for follow-up      No orders of the defined types were placed in this encounter.  The patient has a good  understanding of the overall plan. she agrees with it. she will call with any problems that may develop before the next visit here.   Harriette Ohara, MD 01/05/18

## 2018-01-05 NOTE — Assessment & Plan Note (Signed)
Right lumpectomy 03/08/2016: DCIS intermediate grade with calcifications, 0.9 cm, margins negative, ER 100%, PR 80%, Tis NX stage 0 Refused adj XRT  Treatment Plan: Tamoxifen 20 mg daily x 5 years started 06/22/2016  Tamoxifen Toxicities: 1. Occasional hot flashes Denies any myalgias  Breast cancer surveillance: 1.  Breast exam 01/05/2018: Benign  2. mammogram 02/07/2017: Benign  Return to clinic in 1 year for follow-up

## 2018-01-19 ENCOUNTER — Other Ambulatory Visit: Payer: Self-pay | Admitting: Cardiovascular Disease

## 2018-01-19 DIAGNOSIS — L821 Other seborrheic keratosis: Secondary | ICD-10-CM | POA: Diagnosis not present

## 2018-01-19 DIAGNOSIS — L720 Epidermal cyst: Secondary | ICD-10-CM | POA: Diagnosis not present

## 2018-01-19 DIAGNOSIS — Z85828 Personal history of other malignant neoplasm of skin: Secondary | ICD-10-CM | POA: Diagnosis not present

## 2018-01-19 DIAGNOSIS — D225 Melanocytic nevi of trunk: Secondary | ICD-10-CM | POA: Diagnosis not present

## 2018-01-19 DIAGNOSIS — D1801 Hemangioma of skin and subcutaneous tissue: Secondary | ICD-10-CM | POA: Diagnosis not present

## 2018-01-26 ENCOUNTER — Ambulatory Visit (INDEPENDENT_AMBULATORY_CARE_PROVIDER_SITE_OTHER): Payer: Medicare Other | Admitting: Family Medicine

## 2018-01-26 ENCOUNTER — Encounter: Payer: Self-pay | Admitting: Family Medicine

## 2018-01-26 VITALS — BP 110/62 | HR 69 | Temp 98.8°F | Resp 12 | Ht 63.75 in | Wt 132.6 lb

## 2018-01-26 DIAGNOSIS — E785 Hyperlipidemia, unspecified: Secondary | ICD-10-CM | POA: Diagnosis not present

## 2018-01-26 DIAGNOSIS — L02215 Cutaneous abscess of perineum: Secondary | ICD-10-CM

## 2018-01-26 DIAGNOSIS — I10 Essential (primary) hypertension: Secondary | ICD-10-CM | POA: Diagnosis not present

## 2018-01-26 MED ORDER — SULFAMETHOXAZOLE-TRIMETHOPRIM 800-160 MG PO TABS: 1.0000 | ORAL_TABLET | Freq: Two times a day (BID) | ORAL | 0 refills | Status: AC

## 2018-01-26 NOTE — Progress Notes (Signed)
HPI:   [name] is a 77 y.o. female, who is here today to establish care.  Former PCP: Dr Burnice Logan Last preventive routine visit: 07/2017  Chronic medical problems: HTN,HLD,DCIS (s/p right lumpectomy and on Tamoxifen), and anxiety disorder among some.  Hypertension:    Currently on Carvedilol 12.5 mg bid and Lisinopril 5 mg daily. Hx of Takotsubo cardiomyopathy. She follows with cardiologist,Dr  Nehser.   She is taking medications as instructed, no side effects reported.  She has not noted unusual headache, visual changes, exertional chest pain, dyspnea,  focal weakness, or edema.   Lab Results  Component Value Date   CREATININE 0.97 04/17/2017   BUN 13 04/17/2017   NA 140 04/17/2017   K 4.4 04/17/2017   CL 101 04/17/2017   CO2 26 04/17/2017   HLD: She is on Atorvastatin 20 mg. She follows a healthy diet.  Lab Results  Component Value Date   CHOL 137 07/12/2016   HDL 51.00 07/12/2016   LDLCALC 58 07/12/2016   LDLDIRECT 174.5 03/14/2013   TRIG 139.0 07/12/2016   CHOLHDL 3 07/12/2016     Concerns today:   2 weeks of "ingrown hair" in left side of external genitals. It is improving. No fever,chills,vaginal bleeding or discharge. Mildly painful when sitting. She has not used OTC medication.     Review of Systems  Constitutional: Negative for activity change, appetite change, fatigue and fever.  HENT: Negative for mouth sores, nosebleeds and trouble swallowing.   Eyes: Negative for redness and visual disturbance.  Respiratory: Negative for cough, shortness of breath and wheezing.   Cardiovascular: Negative for chest pain, palpitations and leg swelling.  Gastrointestinal: Negative for abdominal pain, nausea and vomiting.       Negative for changes in bowel habits.  Genitourinary: Negative for decreased urine volume, dysuria, hematuria, vaginal bleeding and vaginal discharge.  Musculoskeletal: Negative for gait problem and myalgias.    Skin: Positive for rash. Negative for wound.  Neurological: Negative for syncope, weakness and headaches.      Current Outpatient Medications on File Prior to Visit  Medication Sig Dispense Refill  . aspirin 81 MG tablet Take 81 mg by mouth daily.      [name] atorvastatin (LIPITOR) 20 MG tablet TAKE 1 TABLET BY MOUTH  DAILY 90 tablet 3  . Calcium Carb-Cholecalciferol (CALCIUM PLUS VITAMIN D3 PO) Take by mouth 2 (two) times daily.      . carvedilol (COREG) 12.5 MG tablet TAKE 1 TABLET BY MOUTH TWO  TIMES DAILY WITH MEALS 180 tablet 1  . DiphenhydrAMINE HCl (BENADRYL PO) Take by mouth.    [name] lisinopril (PRINIVIL,ZESTRIL) 5 MG tablet TAKE 1 TABLET BY MOUTH  DAILY 90 tablet 1  . tamoxifen (NOLVADEX) 20 MG tablet Take 1 tablet (20 mg total) by mouth daily. 90 tablet 3   No current facility-administered medications on file prior to visit.      Past Medical History:  Diagnosis Date  . ACS (acute coronary syndrome) ([place]) 10/20/2010   Normal coronaries per cath 2/12  . Atrophic vaginitis   . Breast cancer of upper-outer quadrant of right female breast ([place]) 01/28/2016  . Cancer ([place])    skin cancer-basal cell  . Diverticulosis   . History of kidney stones   . Hypertension   . Insomnia   . Myocardial infarction ([place])    taksubo syndrome  . Osteoporosis 01/2017   T score -2.5 overall stable from prior DEXA  . Salmonella   .  Takotsubo cardiomyopathy 10/21/2010   Acute coronary syndrome/Takotsubo cardiomyopathy   Allergies  Allergen Reactions  . No Known Allergies     Family History  Problem Relation Age of Onset  . Hypertension Mother   . Cancer Father        prostate  . Diabetes Brother        liver translplant and medication induced the diabetes  . Diabetes Paternal Uncle   . Diabetes Cousin   . Cervical cancer Sister   . Heart disease Neg Hx   . Colon cancer Neg Hx     Social History   Socioeconomic History  . Marital status: Married    Spouse name: Not on file  .  Number of children: 2  . Years of education: Not on file  . Highest education level: Not on file  Occupational History  . Occupation: retired  Scientific laboratory technician  . Financial resource strain: Not on file  . Food insecurity:    Worry: Not on file    Inability: Not on file  . Transportation needs:    Medical: Not on file    Non-medical: Not on file  Tobacco Use  . Smoking status: Never Smoker  . Smokeless tobacco: Never Used  Substance and Sexual Activity  . Alcohol use: Yes    Alcohol/week: 0.0 oz    Comment: once or twice  a month a glasses of wine  . Drug use: No  . Sexual activity: Not Currently    Birth control/protection: Post-menopausal    Comment: 1st intercourse 51 yo-1 partner  Lifestyle  . Physical activity:    Days per week: Not on file    Minutes per session: Not on file  . Stress: Not on file  Relationships  . Social connections:    Talks on phone: Not on file    Gets together: Not on file    Attends religious service: Not on file    Active member of club or organization: Not on file    Attends meetings of clubs or organizations: Not on file    Relationship status: Not on file  Other Topics Concern  . Not on file  Social History Narrative  . Not on file    Vitals:   01/26/18 1151  BP: 110/62  Pulse: 69  Resp: 12  Temp: 98.8 F (37.1 C)  SpO2: 99%    Body mass index is 22.94 kg/m.   Physical Exam  Nursing note and vitals reviewed. Constitutional: She is oriented to person, place, and time. She appears well-developed and well-nourished. No distress.  HENT:  Head: Normocephalic and atraumatic.  Mouth/Throat: Oropharynx is clear and moist and mucous membranes are normal.  Eyes: Pupils are equal, round, and reactive to light. Conjunctivae and EOM are normal.  Cardiovascular: Normal rate and regular rhythm.  No murmur heard. Pulses:      Dorsalis pedis pulses are 2+ on the right side, and 2+ on the left side.  Respiratory: Effort normal and breath  sounds normal. No respiratory distress.  GI: Soft. She exhibits no mass. There is no hepatomegaly. There is no tenderness.  Genitourinary:     Genitourinary Comments: Mildly erythematous and indurated lesion under left minor labia. No fluctuant area, tender with palpation, about 1.3 cm.  Musculoskeletal: She exhibits no edema.  Lymphadenopathy:    She has no cervical adenopathy.       Right: No inguinal adenopathy present.       Left: No inguinal adenopathy present.  Neurological:  She is alert and oriented to person, place, and time. She has normal strength. Gait normal.  Skin: Skin is warm. Lesion noted. There is erythema.  Psychiatric: She has a normal mood and affect.  Well groomed, good eye contact.     ASSESSMENT AND PLAN:  Ms. Charnika was seen today for establish care.  Diagnoses and all orders for this visit:  Perineal abscess, superficial  ? Bartholin abscess. Sitz bath a few times per day. Bactrim x 7 days,some side effects discussed. Instructed about warning signs. F/U as needed.   -     sulfamethoxazole-trimethoprim (BACTRIM DS,SEPTRA DS) 800-160 MG tablet; Take 1 tablet by mouth 2 (two) times daily for 7 days.  Essential hypertension  Adequately controlled. No changes in current management. Low salt diet to continue. Eye exam recommended annually. F/U in 6 months, before if needed.  Dyslipidemia  No changes in Atorvastatin. We will plan on checking FLP in 06/2018.       Sanjith Siwek G. Martinique, MD  [facility] office.

## 2018-01-26 NOTE — Patient Instructions (Addendum)
A few things to remember from today's visit:   Essential hypertension  Dyslipidemia  Perineal abscess, superficial - Plan: sulfamethoxazole-trimethoprim (BACTRIM DS,SEPTRA DS) 800-160 MG tablet  Sitz bath in warm water.   Please be sure medication list is accurate. If a new problem present, please set up appointment sooner than planned today.

## 2018-01-29 ENCOUNTER — Ambulatory Visit: Payer: Medicare Other | Admitting: Gynecology

## 2018-01-29 ENCOUNTER — Encounter: Payer: Self-pay | Admitting: Gynecology

## 2018-01-29 VITALS — BP 116/78

## 2018-01-29 DIAGNOSIS — L723 Sebaceous cyst: Secondary | ICD-10-CM | POA: Diagnosis not present

## 2018-01-29 NOTE — Patient Instructions (Signed)
Follow-up if the area of concern worsens or changes.

## 2018-01-29 NOTE — Progress Notes (Signed)
    Jocelyn Day Apr 13, 1941 [id number]        77 y.o.  G2P2002 presents having noticed tender bump in the left perirectal area last week.  Saw her primary physician who ultimately prescribed Septra DS x1 week for small abscess.  She presents in follow-up now.  Notes the area seems to be improved although still present.  No drainage.  No fever or chills.  No history of same before.  Past medical history,surgical history, problem list, medications, allergies, family history and social history were all reviewed and documented in the EPIC chart.  Directed ROS with pertinent positives and negatives documented in the history of present illness/assessment and plan.  Exam: Jocelyn Day assistant Vitals:   01/29/18 1130  BP: 116/78   General appearance:  Normal External BUS vagina with minimally tender nodule left perirectal area as prescribed.  No overlying skin erythema, drainage or pointing to suggest active infection.  Rectal exam shows this area separate from the rectal mucosa.  Assessment/Plan:  77 y.o. [id number] with suspected sebaceous cyst in the perirectal area.  No significant inflammation/cellulitis.  No evidence of a perirectal abscess.  Recommend patient complete her course of antibiotics and monitor this area.  If it remains stable/resolves then recommend observation.  If it would change/enlarge/become more painful or any other changes of concern she will call and I recommend she follow-up with a general surgeon for further evaluation and management.    Jocelyn Auerbach MD, 11:48 AM 01/29/2018

## 2018-02-13 ENCOUNTER — Encounter: Payer: Self-pay | Admitting: Gynecology

## 2018-02-13 DIAGNOSIS — R928 Other abnormal and inconclusive findings on diagnostic imaging of breast: Secondary | ICD-10-CM | POA: Diagnosis not present

## 2018-02-13 DIAGNOSIS — Z853 Personal history of malignant neoplasm of breast: Secondary | ICD-10-CM | POA: Diagnosis not present

## 2018-02-27 ENCOUNTER — Telehealth: Payer: Self-pay | Admitting: *Deleted

## 2018-02-27 NOTE — Telephone Encounter (Signed)
Patient called requested date of last pap smear, patient informed June 2016.

## 2018-03-19 DIAGNOSIS — H353131 Nonexudative age-related macular degeneration, bilateral, early dry stage: Secondary | ICD-10-CM | POA: Diagnosis not present

## 2018-03-19 DIAGNOSIS — H2513 Age-related nuclear cataract, bilateral: Secondary | ICD-10-CM | POA: Diagnosis not present

## 2018-04-13 ENCOUNTER — Ambulatory Visit: Payer: Medicare Other | Admitting: Gynecology

## 2018-04-13 ENCOUNTER — Encounter: Payer: Self-pay | Admitting: Gynecology

## 2018-04-13 VITALS — BP 120/70 | Ht 65.0 in | Wt 132.0 lb

## 2018-04-13 DIAGNOSIS — Z01419 Encounter for gynecological examination (general) (routine) without abnormal findings: Secondary | ICD-10-CM

## 2018-04-13 DIAGNOSIS — M810 Age-related osteoporosis without current pathological fracture: Secondary | ICD-10-CM

## 2018-04-13 DIAGNOSIS — N952 Postmenopausal atrophic vaginitis: Secondary | ICD-10-CM | POA: Diagnosis not present

## 2018-04-13 NOTE — Progress Notes (Signed)
    Jocelyn Day 06-01-1941 [id number]        77 y.o.  G2P2002 for annual gynecologic exam.  Without gynecologic complaints  Past medical history,surgical history, problem list, medications, allergies, family history and social history were all reviewed and documented as reviewed in the EPIC chart.  ROS:  Performed with pertinent positives and negatives included in the history, assessment and plan.   Additional significant findings : None   Exam: [name] assistant Vitals:   04/13/18 1020  BP: 120/70  Weight: 132 lb (59.9 kg)  Height: 5\' 5"  (1.651 m)   Body mass index is 21.97 kg/m.  General appearance:  Normal affect, orientation and appearance. Skin: Grossly normal HEENT: Without gross lesions.  No cervical or supraclavicular adenopathy. Thyroid normal.  Lungs:  Clear without wheezing, rales or rhonchi Cardiac: RR, without RMG Abdominal:  Soft, nontender, without masses, guarding, rebound, organomegaly or hernia Breasts:  Examined lying and sitting without masses, retractions, discharge or axillary adenopathy.  Well-healed right lumpectomy scar. Pelvic:  Ext, BUS, Vagina: With atrophic changes   Cervix: With atrophic changes  Uterus: Anteverted, normal size, shape and contour, midline and mobile nontender   Adnexa: Without masses or tenderness    Anus and perineum: Normal   Rectovaginal: Normal sphincter tone without palpated masses or tenderness.    Assessment/Plan:  77 y.o. [id number] female for annual gynecologic exam.   1. Postmenopausal/atrophic genital changes.  No significant menopausal symptoms or any bleeding. 2. Osteoporosis.  DEXA 2018 T score -2.1.  We previously discussed treatment options and she elected no further treatment other than her tamoxifen vitamin D and calcium supplementation.  She is active and walks.  Plans to repeat the DEXA next year at 2-year interval. 3. History of right breast cancer.  Exam NED.  Mammography 01/2018.  Continue to follow-up  with oncology.  Currently on tamoxifen. 4. Pap smear 2012.  No history of abnormal Pap smears.  No Pap smear done today.  We both agree to stop screening per current screening guidelines based on age. 5. Colonoscopy 2014.  Repeat at their recommended interval. 6. Health maintenance.  No routine lab work done as patient does this elsewhere.  Follow-up 1 year, sooner as needed.   Anastasio Auerbach MD, 11:39 AM 04/13/2018

## 2018-04-13 NOTE — Patient Instructions (Addendum)
Follow-up in one year.

## 2018-04-16 ENCOUNTER — Telehealth: Payer: Self-pay | Admitting: *Deleted

## 2018-04-16 ENCOUNTER — Encounter: Payer: Self-pay | Admitting: Family Medicine

## 2018-04-16 ENCOUNTER — Ambulatory Visit (INDEPENDENT_AMBULATORY_CARE_PROVIDER_SITE_OTHER): Payer: Medicare Other | Admitting: Family Medicine

## 2018-04-16 VITALS — BP 110/74 | HR 74 | Temp 98.7°F | Resp 12 | Ht 63.75 in | Wt 132.0 lb

## 2018-04-16 DIAGNOSIS — R310 Gross hematuria: Secondary | ICD-10-CM

## 2018-04-16 DIAGNOSIS — R300 Dysuria: Secondary | ICD-10-CM

## 2018-04-16 DIAGNOSIS — R319 Hematuria, unspecified: Secondary | ICD-10-CM

## 2018-04-16 DIAGNOSIS — N390 Urinary tract infection, site not specified: Secondary | ICD-10-CM

## 2018-04-16 LAB — POC URINALSYSI DIPSTICK (AUTOMATED)
GLUCOSE UA: POSITIVE — AB
Nitrite, UA: POSITIVE
PROTEIN UA: POSITIVE — AB
Urobilinogen, UA: 4 E.U./dL — AB
pH, UA: 5 (ref 5.0–8.0)

## 2018-04-16 MED ORDER — SULFAMETHOXAZOLE-TRIMETHOPRIM 800-160 MG PO TABS: 1.0000 | ORAL_TABLET | Freq: Two times a day (BID) | ORAL | 0 refills | Status: AC

## 2018-04-16 NOTE — Telephone Encounter (Signed)
Patient called back and said she walked into her PCP office for treatment. No Rx needed.

## 2018-04-16 NOTE — Telephone Encounter (Signed)
Septra DS 1 p.o. twice daily x3 days 

## 2018-04-16 NOTE — Progress Notes (Signed)
ACUTE VISIT   HPI:  Chief Complaint  Patient presents with  . Dysuria  . Hematuria    Jocelyn Day is a 77 y.o. female, who is here today complaining of dysuria and gross hematuria that is started this morning. Urinary frequency, she is going to the bathroom every 10 to 15 minutes. Urgency, no incontinence. She has not had a UTI in many years. She is not sexually active. Negative for fever, chills, abdominal pain, nausea, vomiting, vaginal bleeding or discharge.   No history of gross hematuria in the past. History of nephrolithiasis, had an episode in many years.  No history of tobacco use.  No history of CKD.   Lab Results  Component Value Date   CREATININE 0.97 04/17/2017   BUN 13 04/17/2017   NA 140 04/17/2017   K 4.4 04/17/2017   CL 101 04/17/2017   CO2 26 04/17/2017    Review of Systems  Constitutional: Positive for fatigue. Negative for activity change, appetite change and fever.  HENT: Negative for mouth sores and nosebleeds.   Gastrointestinal: Negative for abdominal pain, blood in stool, nausea and vomiting.       No changes in bowel habits.  Genitourinary: Positive for dysuria, frequency, hematuria and urgency. Negative for decreased urine volume, vaginal bleeding and vaginal discharge.  Musculoskeletal: Negative for back pain and myalgias.  Skin: Negative for pallor and rash.  Hematological: Negative for adenopathy. Does not bruise/bleed easily.  Psychiatric/Behavioral: Negative for confusion. The patient is nervous/anxious.       Current Outpatient Medications on File Prior to Visit  Medication Sig Dispense Refill  . aspirin 81 MG tablet Take 81 mg by mouth daily.      [name] atorvastatin (LIPITOR) 20 MG tablet TAKE 1 TABLET BY MOUTH  DAILY 90 tablet 3  . carvedilol (COREG) 12.5 MG tablet TAKE 1 TABLET BY MOUTH TWO  TIMES DAILY WITH MEALS 180 tablet 1  . lisinopril (PRINIVIL,ZESTRIL) 5 MG tablet TAKE 1 TABLET BY MOUTH  DAILY 90 tablet 1  .  tamoxifen (NOLVADEX) 20 MG tablet Take 1 tablet (20 mg total) by mouth daily. 90 tablet 3   No current facility-administered medications on file prior to visit.      Past Medical History:  Diagnosis Date  . ACS (acute coronary syndrome) ([place]) 10/20/2010   Normal coronaries per cath 2/12  . Atrophic vaginitis   . Breast cancer of upper-outer quadrant of right female breast ([place]) 01/28/2016  . Cancer ([place])    skin cancer-basal cell  . Diverticulosis   . History of kidney stones   . Hypertension   . Insomnia   . Myocardial infarction ([place])    taksubo syndrome  . Osteoporosis 01/2017   T score -2.5 overall stable from prior DEXA  . Salmonella   . Takotsubo cardiomyopathy 10/21/2010   Acute coronary syndrome/Takotsubo cardiomyopathy   Allergies  Allergen Reactions  . No Known Allergies     Social History   Socioeconomic History  . Marital status: Married    Spouse name: Not on file  . Number of children: 2  . Years of education: Not on file  . Highest education level: Not on file  Occupational History  . Occupation: retired  Scientific laboratory technician  . Financial resource strain: Not on file  . Food insecurity:    Worry: Not on file    Inability: Not on file  . Transportation needs:    Medical: Not on file    Non-medical:  Not on file  Tobacco Use  . Smoking status: Never Smoker  . Smokeless tobacco: Never Used  Substance and Sexual Activity  . Alcohol use: Yes    Alcohol/week: 0.0 standard drinks    Comment: once or twice  a month a glasses of wine  . Drug use: No  . Sexual activity: Not Currently    Birth control/protection: Post-menopausal    Comment: 1st intercourse 39 yo-1 partner  Lifestyle  . Physical activity:    Days per week: Not on file    Minutes per session: Not on file  . Stress: Not on file  Relationships  . Social connections:    Talks on phone: Not on file    Gets together: Not on file    Attends religious service: Not on file    Active member of club  or organization: Not on file    Attends meetings of clubs or organizations: Not on file    Relationship status: Not on file  Other Topics Concern  . Not on file  Social History Narrative  . Not on file    Vitals:   04/16/18 1620  BP: 110/74  Pulse: 74  Resp: 12  Temp: 98.7 F (37.1 C)  SpO2: 97%   Body mass index is 21.97 kg/m.   Physical Exam  Nursing note and vitals reviewed. Constitutional: She is oriented to person, place, and time. She appears well-developed and well-nourished. No distress.  HENT:  Head: Normocephalic and atraumatic.  Eyes: Conjunctivae are normal.  Cardiovascular: Normal rate and regular rhythm.  No murmur heard. Pulses:      Dorsalis pedis pulses are 2+ on the right side, and 2+ on the left side.  Respiratory: Effort normal and breath sounds normal. No respiratory distress.  GI: Soft. She exhibits no mass. There is no tenderness. There is no CVA tenderness.  Musculoskeletal: She exhibits no edema.  Neurological: She is alert and oriented to person, place, and time. She has normal strength. Gait normal.  Skin: Skin is warm. No rash noted. No erythema.  Psychiatric: Her mood appears anxious.  Well groomed,good eye contact.     ASSESSMENT AND PLAN:   Ms. Jocelyn Day was seen today for dysuria and hematuria.  Diagnoses and all orders for this visit:  Lab Results  Component Value Date   CREATININE 0.97 04/16/2018   BUN 15 04/16/2018   NA 138 04/16/2018   K 4.3 04/16/2018   CL 101 04/16/2018   CO2 30 04/16/2018    Dysuria  Urine dipstick abnormal, 3+ blood and leukocytes.  -     POCT Urinalysis Dipstick (Automated) -     Urine culture  Urinary tract infection with hematuria, site unspecified  Ucx ordered.   Empiric abx treatment started today and will be tailored according to Ucx results and susceptibility report.  Clearly instructed about warning signs. F/U if symptoms persist.  -     Urine culture -      sulfamethoxazole-trimethoprim (BACTRIM DS,SEPTRA DS) 800-160 MG tablet; Take 1 tablet by mouth 2 (two) times daily for 3 days.  Gross hematuria  We discussed possible etiologies, this could be part of her UTI. No symptoms suggestive of nephrolithiasis. Instructed about warning signs. We will repeat urine when symptoms have resolved.  -     Basic metabolic panel      Return in about 6 months (around 10/17/2018) for HTN,HLD.      Betty G. Martinique, MD  [facility]. [place] office.

## 2018-04-16 NOTE — Patient Instructions (Addendum)
A few things to remember from today's visit:   Dysuria - Plan: POCT Urinalysis Dipstick (Automated), Urine culture  Urinary tract infection with hematuria, site unspecified - Plan: Urine culture, sulfamethoxazole-trimethoprim (BACTRIM DS,SEPTRA DS) 800-160 MG tablet  Gross hematuria - Plan: Basic metabolic panel  Essential hypertension    Adequate fluid intake, avoid holding urine for long hours, and over the counter Vit C OR cranberry capsules might help.  Today we will treat empirically with antibiotic, which we might need to change when urine culture comes back depending of bacteria susceptibility.  Seek immediate medical attention if severe abdominal pain, vomiting, fever/chills, or worsening symptoms. F/U if symptomatic are not any better after 2-3 days of antibiotic treatment.  Please be sure medication list is accurate. If a new problem present, please set up appointment sooner than planned today.

## 2018-04-16 NOTE — Telephone Encounter (Signed)
Patient was seen for annual exam on 04/13/18 thought a urine was going to be checked. I told her we collect urine on every patient and no urine was done. Called c/o frequent urination and blood in urine, not able to come in,she is at her eye doctor appointment now. She asked if you can send in Rx? I did recommend urgent care as well. Patient still asked I check with you. Please advise

## 2018-04-17 ENCOUNTER — Encounter: Payer: Self-pay | Admitting: Family Medicine

## 2018-04-17 LAB — BASIC METABOLIC PANEL
BUN: 15 mg/dL (ref 6–23)
CALCIUM: 9.9 mg/dL (ref 8.4–10.5)
CO2: 30 mEq/L (ref 19–32)
CREATININE: 0.97 mg/dL (ref 0.40–1.20)
Chloride: 101 mEq/L (ref 96–112)
GFR: 59.35 mL/min — AB (ref >60.00)
Glucose, Bld: 98 mg/dL (ref 70–99)
Potassium: 4.3 mEq/L (ref 3.5–5.1)
SODIUM: 138 meq/L (ref 135–145)

## 2018-04-18 ENCOUNTER — Encounter: Payer: Self-pay | Admitting: Family Medicine

## 2018-04-18 LAB — URINE CULTURE
MICRO NUMBER: 91017771
SPECIMEN QUALITY:: ADEQUATE

## 2018-04-30 ENCOUNTER — Ambulatory Visit: Payer: Medicare Other | Admitting: Cardiovascular Disease

## 2018-05-09 ENCOUNTER — Encounter: Payer: Self-pay | Admitting: Family Medicine

## 2018-05-09 ENCOUNTER — Ambulatory Visit (INDEPENDENT_AMBULATORY_CARE_PROVIDER_SITE_OTHER): Payer: Medicare Other | Admitting: Family Medicine

## 2018-05-09 VITALS — BP 110/70 | HR 92 | Temp 99.3°F | Resp 16 | Ht 63.75 in | Wt 130.5 lb

## 2018-05-09 DIAGNOSIS — R310 Gross hematuria: Secondary | ICD-10-CM | POA: Diagnosis not present

## 2018-05-09 DIAGNOSIS — R350 Frequency of micturition: Secondary | ICD-10-CM | POA: Diagnosis not present

## 2018-05-09 DIAGNOSIS — R81 Glycosuria: Secondary | ICD-10-CM

## 2018-05-09 LAB — POCT URINALYSIS DIPSTICK
BILIRUBIN UA: NEGATIVE
Glucose, UA: NEGATIVE
KETONES UA: NEGATIVE
Leukocytes, UA: NEGATIVE
NITRITE UA: NEGATIVE
PH UA: 5 (ref 5.0–8.0)
PROTEIN UA: NEGATIVE
Spec Grav, UA: >=1.03 — AB (ref 1.010–1.025)
UROBILINOGEN UA: 0.2 U/dL

## 2018-05-09 LAB — POCT GLYCOSYLATED HEMOGLOBIN (HGB A1C): Hemoglobin A1C: 5 % (ref 4.0–5.6)

## 2018-05-09 MED ORDER — CIPROFLOXACIN HCL 250 MG PO TABS: 250.0000 mg | ORAL_TABLET | Freq: Two times a day (BID) | ORAL | 0 refills | Status: AC

## 2018-05-09 MED ORDER — MIRABEGRON ER 25 MG PO TB24: 25.0000 mg | ORAL_TABLET | Freq: Every day | ORAL | 0 refills | Status: DC

## 2018-05-09 NOTE — Patient Instructions (Addendum)
A few things to remember from today's visit:   Frequent urination - Plan: POC Urinalysis Dipstick, POCT glycosylated hemoglobin (Hb A1C), Ambulatory referral to Urology  Glucosuria - Plan: POCT glycosylated hemoglobin (Hb A1C)  Gross hematuria - Plan: Ambulatory referral to Urology   Ciprofloxacin for 7 days. Appt with urologist will be arranged.

## 2018-05-09 NOTE — Progress Notes (Signed)
HPI:  Chief Complaint  Patient presents with  . Urinary Frequency    started a couple of weeks ago    [name] is a 77 y.o. female, who is here today complaining of a couple weeks of urinary symptoms.   Dysuria: No Urinary frequency: Yes, urinating every 15 min Urinary urgency: Yes Incontinence: No Hematuria: No this time but she was seen on 04/16/18 because dysuria with gross hematuria. Urine was pos for glucosuria,no Hx of DM. Hx of nephrolithiasis.  Abdominal pain: Negative.Occasionally she has suprapubic pressure sensation. No changes in bowel habits. No fever,chills,abnormal wt loss.  Nausea or vomiting: Negative.  Abnormal vaginal bleeding or discharge: Negative.  Sexual activity: No  No Hx of tobacco use. OTC medications for this problem: None.  She is having cataract surgery in a week. She is concerned about having urinary frequency during procedure.   Review of Systems  Constitutional: Negative for activity change, appetite change, fatigue, fever and unexpected weight change.  Gastrointestinal: Negative for abdominal pain, nausea and vomiting.  Genitourinary: Positive for frequency and urgency. Negative for decreased urine volume, dysuria, hematuria, vaginal bleeding and vaginal discharge.  Musculoskeletal: Negative for back pain and myalgias.  Psychiatric/Behavioral: Negative for confusion. The patient is nervous/anxious.       Current Outpatient Medications on File Prior to Visit  Medication Sig Dispense Refill  . aspirin 81 MG tablet Take 81 mg by mouth daily.      [name] atorvastatin (LIPITOR) 20 MG tablet TAKE 1 TABLET BY MOUTH  DAILY 90 tablet 3  . carvedilol (COREG) 12.5 MG tablet TAKE 1 TABLET BY MOUTH TWO  TIMES DAILY WITH MEALS 180 tablet 1  . lisinopril (PRINIVIL,ZESTRIL) 5 MG tablet TAKE 1 TABLET BY MOUTH  DAILY 90 tablet 1  . Multiple Vitamins-Minerals (PRESERVISION AREDS 2 PO) Take 1 tablet by mouth 2 (two) times daily.    .  tamoxifen (NOLVADEX) 20 MG tablet Take 1 tablet (20 mg total) by mouth daily. 90 tablet 3   No current facility-administered medications on file prior to visit.      Past Medical History:  Diagnosis Date  . ACS (acute coronary syndrome) ([place]) 10/20/2010   Normal coronaries per cath 2/12  . Atrophic vaginitis   . Breast cancer of upper-outer quadrant of right female breast ([place]) 01/28/2016  . Cancer ([place])    skin cancer-basal cell  . Diverticulosis   . History of kidney stones   . Hypertension   . Insomnia   . Myocardial infarction ([place])    taksubo syndrome  . Osteoporosis 01/2017   T score -2.5 overall stable from prior DEXA  . Salmonella   . Takotsubo cardiomyopathy 10/21/2010   Acute coronary syndrome/Takotsubo cardiomyopathy   Allergies  Allergen Reactions  . No Known Allergies     Social History   Socioeconomic History  . Marital status: Married    Spouse name: Not on file  . Number of children: 2  . Years of education: Not on file  . Highest education level: Not on file  Occupational History  . Occupation: retired  Scientific laboratory technician  . Financial resource strain: Not on file  . Food insecurity:    Worry: Not on file    Inability: Not on file  . Transportation needs:    Medical: Not on file    Non-medical: Not on file  Tobacco Use  . Smoking status: Never Smoker  . Smokeless tobacco: Never Used  Substance and Sexual Activity  .  Alcohol use: Yes    Alcohol/week: 0.0 standard drinks    Comment: once or twice  a month a glasses of wine  . Drug use: No  . Sexual activity: Not Currently    Birth control/protection: Post-menopausal    Comment: 1st intercourse 29 yo-1 partner  Lifestyle  . Physical activity:    Days per week: Not on file    Minutes per session: Not on file  . Stress: Not on file  Relationships  . Social connections:    Talks on phone: Not on file    Gets together: Not on file    Attends religious service: Not on file    Active member of club  or organization: Not on file    Attends meetings of clubs or organizations: Not on file    Relationship status: Not on file  Other Topics Concern  . Not on file  Social History Narrative  . Not on file    Vitals:   05/09/18 1441  BP: 110/70  Pulse: 92  Resp: 16  Temp: 99.3 F (37.4 C)  SpO2: 96%   Body mass index is 22.58 kg/m.    Physical Exam  Nursing note and vitals reviewed. Constitutional: She is oriented to person, place, and time. She appears well-developed and well-nourished. No distress.  HENT:  Head: Normocephalic and atraumatic.  Mouth/Throat: Mucous membranes are normal.  Eyes: Conjunctivae are normal.  Cardiovascular: Normal rate and regular rhythm.  No murmur heard. Respiratory: Effort normal and breath sounds normal. No respiratory distress.  GI: Soft. She exhibits no mass. There is no tenderness. There is no CVA tenderness.  Musculoskeletal: She exhibits no edema.  Lymphadenopathy:    She has no cervical adenopathy.  Neurological: She is alert and oriented to person, place, and time. She has normal strength. Gait normal.  Skin: Skin is warm. No erythema.  Psychiatric: Her speech is normal. Her mood appears anxious.  Good eye contact, well groomed.    ASSESSMENT AND PLAN:   Ms. Jocelyn Day was seen today for urinary frequency.  Diagnoses and all orders for this visit:  Lab Results  Component Value Date   HGBA1C 5.0 05/09/2018    Frequent urination  Possible etiologies discussed. Will treat empirically with abx but I am not convinced symptoms are caused by UTI. ? Overactive bladder.  Myrbetriq may help with symptoms, urgency and frequency.  -     POC Urinalysis Dipstick -     POCT glycosylated hemoglobin (Hb A1C) -     Ambulatory referral to Urology  Glucosuria  Normal A1C.  -     POCT glycosylated hemoglobin (Hb A1C)  Gross hematuria  Urine dipstick today 2+ urine + Hx of gross hematuria and persistent urinery frequency, so urology  evaluation recommended.  -     Ambulatory referral to Urology  Other orders -     ciprofloxacin (CIPRO) 250 MG tablet; Take 1 tablet (250 mg total) by mouth 2 (two) times daily for 7 days. -     mirabegron ER (MYRBETRIQ) 25 MG TB24 tablet; Take 1 tablet (25 mg total) by mouth daily.    3:14 pm to 3: 51 pm 25 min face to face OV. > 50% was dedicated to discussion of Dx, prognosis, treatment options, and side effects of medications in detail as well as risk of interaction.     Tannor Pyon G. Martinique, MD  [facility]. [place] office.

## 2018-05-22 ENCOUNTER — Telehealth: Payer: Self-pay | Admitting: *Deleted

## 2018-05-22 NOTE — Telephone Encounter (Signed)
Referral already in system. Was closed, but has now been reopened as pt has not been seen by [facility] yet - scheduled for 06/13/18.

## 2018-05-22 NOTE — Telephone Encounter (Signed)
[facility] Call Caller Name Kyllie Pettijohn Phone Number na Call Type Message Only Information Provided Reason for Call Returning a Call from the Office Initial Comment Caller states,Dr. Martinique has stated she needs to be referred, her appointment is the 23rd of October with Dr. Diona Fanti, due to blood in urine due to UTI. Needing a referral for the doctor Additional Comment [id number]

## 2018-05-22 NOTE — Telephone Encounter (Signed)
Looks like referral has already been completed. Can you verify and contact patient?

## 2018-05-31 ENCOUNTER — Encounter: Payer: Self-pay | Admitting: *Deleted

## 2018-06-09 ENCOUNTER — Other Ambulatory Visit: Payer: Self-pay | Admitting: Internal Medicine

## 2018-06-13 DIAGNOSIS — R350 Frequency of micturition: Secondary | ICD-10-CM | POA: Diagnosis not present

## 2018-06-13 DIAGNOSIS — R311 Benign essential microscopic hematuria: Secondary | ICD-10-CM | POA: Diagnosis not present

## 2018-06-14 ENCOUNTER — Encounter: Payer: Self-pay | Admitting: Cardiovascular Disease

## 2018-06-14 ENCOUNTER — Ambulatory Visit: Payer: Medicare Other | Admitting: Cardiovascular Disease

## 2018-06-14 VITALS — BP 134/68 | HR 61 | Ht 63.75 in | Wt 132.0 lb

## 2018-06-14 DIAGNOSIS — I5181 Takotsubo syndrome: Secondary | ICD-10-CM

## 2018-06-14 NOTE — Progress Notes (Signed)
Jocelyn Day Date of Birth  May 15, 1941 [facility] [address]    [place]   [place], [place]  [address]    [place], [place]  [address] [phone]  Fax  [phone]  [phone]  Fax [phone]  Problem List: 1.  Takosubo Syndrome - Feb, 2012, EF = 30%, echo in April, 2012 revealed EF of 45-50%,   EF 60% in August 2013.     Jocelyn Day is a 77 yo with a hx of Takosubo Syndrome.  She has not had any recurrent chest pain.  Her husband died of esophageal cancer this past December. She is exercising some.    Jan 18, 2013:  She is doing well.    No CP. No dyspnea.    January 20, 2014:  Jocelyn Day is doing well.  Has had some shoulder problems.   February 17, 2015:  Jocelyn Day is doing well. No episodes of CP , no dyspnea , Had some nausea and dizziness during a Tai Chi work out  February 18, 2016:  Jocelyn Day is doing well from a cardiac [place]. Has been found to had breast -  DCIS .   Scheduled for lumpectomy.   No plans for chemo or XRT at this point .  Breathing is good .   Aug. 17, 2018:  Seen with daughter, Jocelyn Day  ( nurse anesthetist at [facility] [employer] [facility])   Has had salmonella poisoning  Had low potassium levels Was started on [place] . Is now eating better Diarrhea has resolved.   June 14, 2018: Doing well Had R cataeract surgery .   Seeing better. No cardiac issues.  Exercising well  Primary MD check cholesterol   Current Outpatient Medications on File Prior to Visit  Medication Sig Dispense Refill  . aspirin 81 MG tablet Take 81 mg by mouth daily.      [name] atorvastatin (LIPITOR) 20 MG tablet TAKE 1 TABLET BY MOUTH  DAILY 90 tablet 3  . carvedilol (COREG) 12.5 MG tablet TAKE 1 TABLET BY MOUTH TWO  TIMES DAILY WITH MEALS 180 tablet 1  . lisinopril (PRINIVIL,ZESTRIL) 5 MG tablet TAKE 1 TABLET BY MOUTH  DAILY 90 tablet 1  . Melatonin ([employer] MELATONIN) 10 MG CAPS Take 1 capsule by mouth daily.    . Multiple Vitamins-Minerals (PRESERVISION  AREDS 2 PO) Take 1 tablet by mouth 2 (two) times daily.    . tamoxifen (NOLVADEX) 20 MG tablet Take 1 tablet (20 mg total) by mouth daily. 90 tablet 3   No current facility-administered medications on file prior to visit.     Allergies  Allergen Reactions  . No Known Allergies     Past Medical History:  Diagnosis Date  . ACS (acute coronary syndrome) ([place]) 10/20/2010   Normal coronaries per cath 2/12  . Atrophic vaginitis   . Breast cancer of upper-outer quadrant of right female breast ([place]) 01/28/2016  . Cancer ([place])    skin cancer-basal cell  . Diverticulosis   . History of kidney stones   . Hypertension   . Insomnia   . Myocardial infarction ([place])    taksubo syndrome  . Osteoporosis 01/2017   T score -2.5 overall stable from prior DEXA  . Salmonella   . Takotsubo cardiomyopathy 10/21/2010   Acute coronary syndrome/Takotsubo cardiomyopathy    Past Surgical History:  Procedure Laterality Date  . basal cell carinoma excised  2005   face ,breast rt squamous cell 2016  .  BREAST BIOPSY  01/27/2016   Malignant  . BREAST LUMPECTOMY WITH RADIOACTIVE SEED LOCALIZATION Right 03/08/2016   Procedure: RIGHT BREAST LUMPECTOMY WITH RADIOACTIVE SEED LOCALIZATION;  Surgeon: [name], MD;  Location: [facility];  Service: General;  Laterality: Right;  . CARDIAC CATHETERIZATION  10/21/2010   Normal coronary arteries -- Apical ballooning syndrome consistent with tako-tsubo cardiomyopathy with an ejection fraction of 30%  . CESAREAN SECTION  1974  . [place]  . CHOLECYSTECTOMY  1993  . COLONOSCOPY    . DILATION AND CURETTAGE OF UTERUS  1990  . [place]  2010  . POLYPECTOMY      Social History   Tobacco Use  Smoking Status Never Smoker  Smokeless Tobacco Never Used    Social History   Substance and Sexual Activity  Alcohol Use Yes  . Alcohol/week: 0.0 standard drinks   Comment: once or twice  a month a glasses of wine    Family History  Problem  Relation Age of Onset  . Hypertension Mother   . Prostate cancer Father   . Diabetes Brother        liver translplant and medication induced the diabetes  . Diabetes Paternal Uncle   . Diabetes Cousin   . Cervical cancer Sister   . Heart disease Neg Hx   . Colon cancer Neg Hx     Reviw of Systems:  Reviewed in the HPI.  All other systems are negative.  Physical Exam: Blood pressure 134/68, pulse 61, height 5' 3.75" (1.619 m), weight 132 lb (59.9 kg), SpO2 98 %.  GEN:  Well nourished, well developed in no acute distress HEENT: Normal NECK: No JVD; No carotid bruits LYMPHATICS: No lymphadenopathy CARDIAC: RRR , no murmurs, rubs, gallops RESPIRATORY:  Clear to auscultation without rales, wheezing or rhonchi  ABDOMEN: Soft, non-tender, non-distended MUSCULOSKELETAL:  No edema; No deformity  SKIN: Warm and dry NEUROLOGIC:  Alert and oriented x 3    ECG: June 14, 2018: Normal sinus rhythm at 61 beats minute.  Normal EKG.  Assessment / Plan:   1.  Takosubo Syndrome - Feb, 2012, EF = 30%, echo in April, 2012 revealed EF of 45-50%,  EF is now 60% by echo     2.  Hypokalemia:    K is now normal  She has already stopped the [place].   Will take if off her list and will recheck a BMP in 1-2 weeks     She is doing well .    Continue same meds.  I will see her in 1 year   Jocelyn Moores, MD  06/14/2018 4:42 PM    [facility] [address],  [place], [place]  [address] Pager [phone] Phone: [phone]; Fax: [phone]

## 2018-06-14 NOTE — Patient Instructions (Signed)
Medication Instructions:  Your physician recommends that you continue on your current medications as directed. Please refer to the Current Medication list given to you today.  If you need a refill on your cardiac medications before your next appointment, please call your pharmacy.    Lab work: None Ordered  If you have labs (blood work) drawn today and your tests are completely normal, you will receive your results only by: . MyChart Message (if you have MyChart) OR . A paper copy in the mail If you have any lab test that is abnormal or we need to change your treatment, we will call you to review the results.   Testing/Procedures: None Ordered   Follow-Up: At [facility], you and your health needs are our priority.  As part of our continuing mission to provide you with exceptional heart care, we have created designated Provider Care Teams.  These Care Teams include your primary Cardiologist (physician) and Advanced Practice Providers (APPs -  Physician Assistants and Nurse Practitioners) who all work together to provide you with the care you need, when you need it. You will need a follow up appointment in:  1 years.  Please call our office 2 months in advance to schedule this appointment.  You may see Philip Nahser, MD or one of the following Advanced Practice Providers on your designated Care Team: Scott Weaver, PA-C Vin Bhagat, PA-C . Janine Hammond, NP    

## 2018-06-28 DIAGNOSIS — H25812 Combined forms of age-related cataract, left eye: Secondary | ICD-10-CM | POA: Diagnosis not present

## 2018-06-28 DIAGNOSIS — H2512 Age-related nuclear cataract, left eye: Secondary | ICD-10-CM | POA: Diagnosis not present

## 2018-07-05 DIAGNOSIS — L570 Actinic keratosis: Secondary | ICD-10-CM | POA: Diagnosis not present

## 2018-07-05 DIAGNOSIS — L820 Inflamed seborrheic keratosis: Secondary | ICD-10-CM | POA: Diagnosis not present

## 2018-09-26 ENCOUNTER — Other Ambulatory Visit: Payer: Self-pay | Admitting: Cardiovascular Disease

## 2018-12-25 ENCOUNTER — Other Ambulatory Visit: Payer: Self-pay | Admitting: Hematology and Oncology

## 2018-12-31 NOTE — Assessment & Plan Note (Signed)
Right lumpectomy 03/08/2016: DCIS intermediate grade with calcifications, 0.9 cm, margins negative, ER 100%, PR 80%, Tis NX stage 0 Refused adj XRT  Treatment Plan: Tamoxifen 20 mg daily x 5 yearsstarted 06/22/2016  Tamoxifen Toxicities: 1.Occasional hot flashes Denies any myalgias  Breast cancer surveillance: 1.  Breast exam 01/05/2018: Benign  2. mammogram 02/13/2018 at [facility]: Benign breast density category B  Return to clinic in 1 year for follow-up

## 2019-01-02 ENCOUNTER — Telehealth: Payer: Self-pay | Admitting: Hematology and Oncology

## 2019-01-02 NOTE — Telephone Encounter (Signed)
Called patient regarding upcoming Webex appointment, patient would prefer this be a Doximity call. Patient is notified of date and time.

## 2019-01-03 NOTE — Progress Notes (Signed)
HEMATOLOGY-ONCOLOGY DOXIMITY VISIT PROGRESS NOTE  I connected with Jocelyn Day on 01/04/2019 at 11:00 AM EDT by Doximity video conference and verified that I am speaking with the correct person using two identifiers.  I discussed the limitations, risks, security and privacy concerns of performing an evaluation and management service by Doximity and the availability of in person appointments.  I also discussed with the patient that there may be a patient responsible charge related to this service. The patient expressed understanding and agreed to proceed.  Patient's Location: Home Physician Location: Clinic  CHIEF COMPLIANT: Follow-up on tamoxifen therapy  INTERVAL HISTORY: Jocelyn Day is a 78 y.o. female with above-mentioned history of right breast DCIS treated with lumpectomy and is currently on antiestrogen therapy with tamoxifen. I last saw her a year ago. Her most recent mammogram on 02/13/18 showed no evidence of malignancy. She presents today over Doximity for annual follow-up.  Cataract surgery last year    Breast cancer of upper-outer quadrant of right female breast ([place])   01/27/2016 Initial Diagnosis    Right breast biopsy: DCIS with calcifications low to intermediate grade, ER 100%, PR 80%, Tis N0 stage 0; screening mammogram revealed right breast calcifications 8 mm    03/08/2016 Surgery    Right lumpectomy (ingram): DCIS intermediate grade with calcifications, 0.9 cm, margins negative, ER 100%, PR 80%, Tis NX stage 0     Radiation Therapy    Refused XRT    06/22/2016 -  Anti-estrogen oral therapy    Tamoxifen 20mg  daily x 5 years     REVIEW OF SYSTEMS:   Constitutional: Denies fevers, chills or abnormal weight loss Eyes: Denies blurriness of vision Ears, nose, mouth, throat, and face: Denies mucositis or sore throat Respiratory: Denies cough, dyspnea or wheezes Cardiovascular: Denies palpitation, chest discomfort Gastrointestinal:  Denies nausea, heartburn or change in  bowel habits Skin: Denies abnormal skin rashes Lymphatics: Denies new lymphadenopathy or easy bruising Neurological:Denies numbness, tingling or new weaknesses Behavioral/Psych: Mood is stable, no new changes  Extremities: No lower extremity edema Breast: denies any pain or lumps or nodules in either breasts All other systems were reviewed with the patient and are negative.  Observations/Objective:  There were no vitals filed for this visit. There is no height or weight on file to calculate BMI.  I have reviewed the data as listed CMP Latest Ref Rng & Units 04/16/2018 04/17/2017 04/03/2017  Glucose 70 - 99 mg/dL 98 129(H) 109(H)  BUN 6 - 23 mg/dL 15 13 16   Creatinine 0.40 - 1.20 mg/dL 0.97 0.97 0.91  Sodium 135 - 145 mEq/L 138 140 136  Potassium 3.5 - 5.1 mEq/L 4.3 4.4 4.9  Chloride 96 - 112 mEq/L 101 101 101  CO2 19 - 32 mEq/L 30 26 28   Calcium 8.4 - 10.5 mg/dL 9.9 9.2 9.7  Total Protein 6.0 - 8.3 g/dL - - 6.9  Total Bilirubin 0.2 - 1.2 mg/dL - - 0.7  Alkaline Phos 39 - 117 U/L - - 39  AST 0 - 37 U/L - - 22  ALT 0 - 35 U/L - - 15    Lab Results  Component Value Date   WBC 8.6 04/03/2017   HGB 15.3 (H) 04/03/2017   HCT 45.6 04/03/2017   MCV 98.4 04/03/2017   PLT 272.0 04/03/2017   NEUTROABS 5.6 04/03/2017      Assessment Plan:  Breast cancer of upper-outer quadrant of right female breast ([place]) Right lumpectomy 03/08/2016: DCIS intermediate grade with calcifications, 0.9 cm, margins  negative, ER 100%, PR 80%, Tis NX stage 0 Refused adj XRT  Treatment Plan: Tamoxifen 20 mg daily x 5 yearsstarted 03/22/2016  Tamoxifen Toxicities: No side effects  Breast cancer surveillance: 1.  Breast exam 01/05/2018: Benign  2. mammogram  02/13/2018 at [facility]: Benign breast density category B  Return to clinic in 1 year for follow-up  I discussed the assessment and treatment plan with the patient. The patient was provided an opportunity to ask questions and all were answered. The  patient agreed with the plan and demonstrated an understanding of the instructions. The patient was advised to call back or seek an in-person evaluation if the symptoms worsen or if the condition fails to improve as anticipated.   I provided 15 minutes of face-to-face Doximity time during this encounter.    Rulon Eisenmenger, MD 01/04/2019   I, Molly Dorshimer, am acting as scribe for Nicholas Lose, MD.  I have reviewed the above documentation for accuracy and completeness, and I agree with the above.

## 2019-01-04 ENCOUNTER — Inpatient Hospital Stay: Payer: Medicare Other | Attending: Hematology and Oncology | Admitting: Hematology and Oncology

## 2019-01-04 DIAGNOSIS — Z170 Estrogen receptor positive status [ER+]: Secondary | ICD-10-CM | POA: Diagnosis not present

## 2019-01-04 DIAGNOSIS — C50411 Malignant neoplasm of upper-outer quadrant of right female breast: Secondary | ICD-10-CM | POA: Diagnosis not present

## 2019-01-04 DIAGNOSIS — Z79810 Long term (current) use of selective estrogen receptor modulators (SERMs): Secondary | ICD-10-CM | POA: Diagnosis not present

## 2019-02-14 ENCOUNTER — Telehealth: Payer: Self-pay | Admitting: Hematology and Oncology

## 2019-02-14 NOTE — Telephone Encounter (Signed)
Scheduled appt per 6/24 sch message - no answer - mailed letter with appt date and time

## 2019-02-15 ENCOUNTER — Encounter: Payer: Self-pay | Admitting: Gynecology

## 2019-02-20 ENCOUNTER — Encounter: Payer: Self-pay | Admitting: Gynecology

## 2019-02-21 ENCOUNTER — Encounter: Payer: Self-pay | Admitting: Gynecology

## 2019-04-17 ENCOUNTER — Other Ambulatory Visit: Payer: Self-pay

## 2019-04-18 ENCOUNTER — Ambulatory Visit (INDEPENDENT_AMBULATORY_CARE_PROVIDER_SITE_OTHER): Payer: Medicare Other | Admitting: Gynecology

## 2019-04-18 ENCOUNTER — Other Ambulatory Visit: Payer: Self-pay

## 2019-04-18 ENCOUNTER — Encounter: Payer: Self-pay | Admitting: Gynecology

## 2019-04-18 VITALS — BP 122/76 | Ht 64.0 in | Wt 132.0 lb

## 2019-04-18 DIAGNOSIS — Z01419 Encounter for gynecological examination (general) (routine) without abnormal findings: Secondary | ICD-10-CM | POA: Diagnosis not present

## 2019-04-18 DIAGNOSIS — M810 Age-related osteoporosis without current pathological fracture: Secondary | ICD-10-CM | POA: Diagnosis not present

## 2019-04-18 DIAGNOSIS — N952 Postmenopausal atrophic vaginitis: Secondary | ICD-10-CM | POA: Diagnosis not present

## 2019-04-18 DIAGNOSIS — Z853 Personal history of malignant neoplasm of breast: Secondary | ICD-10-CM | POA: Diagnosis not present

## 2019-04-18 NOTE — Patient Instructions (Signed)
Follow-up in 1 year for annual exam 

## 2019-04-18 NOTE — Progress Notes (Signed)
    Jocelyn Day [birth year] [id number]        78 y.o.  G2P2002 for annual gynecologic exam.  Without gynecologic complaints  Past medical history,surgical history, problem list, medications, allergies, family history and social history were all reviewed and documented as reviewed in the EPIC chart.  ROS:  Performed with pertinent positives and negatives included in the history, assessment and plan.   Additional significant findings : None   Exam: Jocelyn Day assistant Vitals:   04/18/19 1018  BP: 122/76  Weight: 132 lb (59.9 kg)  Height: 5\' 4"  (1.626 m)   Body mass index is 22.66 kg/m.  General appearance:  Normal affect, orientation and appearance. Skin: Grossly normal HEENT: Without gross lesions.  No cervical or supraclavicular adenopathy. Thyroid normal.  Lungs:  Clear without wheezing, rales or rhonchi Cardiac: RR, without RMG Abdominal:  Soft, nontender, without masses, guarding, rebound, organomegaly or hernia Breasts:  Examined lying and sitting without masses, retractions, discharge or axillary adenopathy. Pelvic:  Ext, BUS, Vagina: With atrophic changes  Cervix: With atrophic changes  Uterus: Anteverted, normal size, shape and contour, midline and mobile nontender   Adnexa: Without masses or tenderness    Anus and perineum: Normal   Rectovaginal: Normal sphincter tone without palpated masses or tenderness.    Assessment/Plan:  78 y.o. G19P2002 female for annual gynecologic exam.   1. Postmenopausal.  No significant menopausal symptoms or any bleeding. 2. Osteoporosis.  DEXA 2020 T score -2.6 overall stable from prior DEXA.  We again discussed treatment options and her increased risk of fracture.  The patient continues on tamoxifen.  She declines any medication understanding and accepting the risks of fracture.  We will plan on repeating the bone density in 2 years. 3. Mammography 01/2019.  History of right breast cancer.  Exam NED.  Continue follow-up with oncology  and her tamoxifen. 4. Pap smear 2012.  No Pap smear done today.  No history of abnormal Pap smears.  We both agree to stop screening per current screening guidelines. 5. Colonoscopy 2014.  Repeat at their recommended interval. 6. Health maintenance.  No routine lab work done as patient does this elsewhere.  Follow-up 1 year, sooner as needed.   Anastasio Auerbach MD, 11:04 AM 04/18/2019

## 2019-05-30 ENCOUNTER — Encounter: Payer: Self-pay | Admitting: Gynecology

## 2019-06-07 ENCOUNTER — Other Ambulatory Visit: Payer: Self-pay | Admitting: Cardiovascular Disease

## 2019-06-17 ENCOUNTER — Encounter: Payer: Self-pay | Admitting: Cardiovascular Disease

## 2019-06-17 NOTE — Progress Notes (Signed)
      Jocelyn Day Date of Birth  10/08/1940  Problem List: 1.  Takosubo Syndrome - Feb, 2012, EF = 30%, echo in April, 2012 revealed EF of 45-50%,   EF 60% in August 2013.     Jocelyn Day is a 78 y.o.  with a hx of Takosubo Syndrome.  She has not had any recurrent chest pain.  Her husband died of esophageal cancer this past December. She is exercising some.    Jan 18, 2013:  She is doing well.    No CP. No dyspnea.    January 20, 2014:  Jocelyn Day is doing well.  Has had some shoulder problems.   February 17, 2015:  Jocelyn Day is doing well. No episodes of CP , no dyspnea , Had some nausea and dizziness during a Tai Chi work out  February 18, 2016:  Jocelyn Day is doing well from a cardiac standpont. Has been found to had breast -  DCIS .   Scheduled for lumpectomy.   No plans for chemo or XRT at this point .  Breathing is good .   Aug. 17, 2018:  Seen with daughter, Elizabeth  ( nurse anesthetist at [employer])   Has had salmonella poisoning  Had low potassium levels Was started on [place] . Is now eating better Diarrhea has resolved.   June 14, 2018: Doing well Had R cataeract surgery .   Seeing better. No cardiac issues.  Exercising well  Primary MD check cholesterol   Oct. 27, 2020  Jocelyn Day is seen today for follow up of her hx of Takotsubo syndrome History of right breast DCIS cancer.  She was treated with lumpectomy and is currently on tamoxifen.  She seems to be overall doing very well. Wt is 143 lbs.  Having some allergies    Current Outpatient Medications on File Prior to Visit  Medication Sig Dispense Refill  . aspirin 81 MG tablet Take 81 mg by mouth daily.      . atorvastatin (LIPITOR) 20 MG tablet TAKE 1 TABLET BY MOUTH  DAILY 90 tablet 3  . carvedilol (COREG) 12.5 MG tablet TAKE 1 TABLET BY MOUTH TWO  TIMES DAILY WITH MEALS 180 tablet 3  . lisinopril (ZESTRIL) 5 MG tablet TAKE 1 TABLET BY MOUTH  DAILY 90 tablet 3  . Melatonin (CVS MELATONIN) 10 MG CAPS Take 1 capsule by mouth  daily.    . Multiple Vitamins-Minerals (PRESERVISION AREDS 2 PO) Take 1 tablet by mouth 2 (two) times daily.    . tamoxifen (NOLVADEX) 20 MG tablet TAKE 1 TABLET BY MOUTH  DAILY 90 tablet 3  . VITAMIN D PO Take by mouth.     No current facility-administered medications on file prior to visit.     Allergies  Allergen Reactions  . No Known Allergies     Past Medical History:  Diagnosis Date  . ACS (acute coronary syndrome) (HCC) 10/20/2010   Normal coronaries per cath 2/12  . Atrophic vaginitis   . Breast cancer of upper-outer quadrant of right female breast (HCC) 01/28/2016  . Cancer (HCC)    skin cancer-basal cell  . Diverticulosis   . History of kidney stones   . Hypertension   . Insomnia   . Myocardial infarction (HCC)    taksubo syndrome  . Osteoporosis 01/2019   T score -2.6 overall stable from prior DEXA  . Salmonella   . Takotsubo cardiomyopathy 10/21/2010   Acute coronary syndrome/Takotsubo cardiomyopathy    Past Surgical History:    Procedure Laterality Date  . basal cell carinoma excised  2005   face ,breast rt squamous cell 2016  . BREAST BIOPSY  01/27/2016   Malignant  . BREAST LUMPECTOMY WITH RADIOACTIVE SEED LOCALIZATION Right 03/08/2016   Procedure: RIGHT BREAST LUMPECTOMY WITH RADIOACTIVE SEED LOCALIZATION;  Surgeon: [name], MD;  Location: [facility];  Service: General;  Laterality: Right;  . CARDIAC CATHETERIZATION  10/21/2010   Normal coronary arteries -- Apical ballooning syndrome consistent with tako-tsubo cardiomyopathy with an ejection fraction of 30%  . CESAREAN SECTION  1974  . [place]  . CHOLECYSTECTOMY  1993  . COLONOSCOPY    . DILATION AND CURETTAGE OF UTERUS  1990  . [place]  2010  . POLYPECTOMY      Social History   Tobacco Use  Smoking Status Never Smoker  Smokeless Tobacco Never Used    Social History   Substance and Sexual Activity  Alcohol Use Yes  . Alcohol/week: 0.0 standard drinks   Comment:  once or twice  a month a glasses of wine    Family History  Problem Relation Age of Onset  . Hypertension Mother   . Prostate cancer Father   . Diabetes Brother        liver translplant and medication induced the diabetes  . Diabetes Paternal Uncle   . Diabetes Cousin   . Cervical cancer Sister   . Heart disease Neg Hx   . Colon cancer Neg Hx     Reviw of Systems:  Reviewed in the HPI.  All other systems are negative.  Physical Exam: There were no vitals taken for this visit.  GEN:  Well nourished, well developed in no acute distress HEENT: Normal NECK: No JVD; No carotid bruits LYMPHATICS: No lymphadenopathy CARDIAC: RRR  RESPIRATORY:  Clear to auscultation without rales, wheezing or rhonchi  ABDOMEN: Soft, non-tender, non-distended MUSCULOSKELETAL:  No edema; No deformity  SKIN: Warm and dry NEUROLOGIC:  Alert and oriented x 3   ECG: Oct. 27, 2020:  NSR at 56.  NS ST abn.   Assessment / Plan:   1.  Takosubo Syndrome - Feb, 2012, EF = 30%, echo in April, 2012 revealed EF of 45-50%,  EF is now 60% by echo No further symptoms .  Doing well     She will have her labs checked in Dec. By Dr. Sofie Hartigan, MD  06/17/2019 9:05 PM    [address],  [place], [place]  [address] Phone: [phone]; Fax: [phone]

## 2019-06-18 ENCOUNTER — Encounter: Payer: Self-pay | Admitting: Cardiovascular Disease

## 2019-06-18 ENCOUNTER — Other Ambulatory Visit: Payer: Self-pay

## 2019-06-18 ENCOUNTER — Ambulatory Visit: Payer: Medicare Other | Admitting: Cardiovascular Disease

## 2019-06-18 VITALS — BP 122/68 | HR 69 | Ht 64.0 in | Wt 134.2 lb

## 2019-06-18 DIAGNOSIS — I10 Essential (primary) hypertension: Secondary | ICD-10-CM | POA: Diagnosis not present

## 2019-06-18 DIAGNOSIS — I5181 Takotsubo syndrome: Secondary | ICD-10-CM

## 2019-06-18 NOTE — Patient Instructions (Signed)
Medication Instructions:  Your provider recommends that you continue on your current medications as directed. Please refer to the Current Medication list given to you today.   *If you need a refill on your cardiac medications before your next appointment, please call your pharmacy*   Follow-Up: At [facility], you and your health needs are our priority.  As part of our continuing mission to provide you with exceptional heart care, we have created designated Provider Care Teams.  These Care Teams include your primary Cardiologist (physician) and Advanced Practice Providers (APPs -  Physician Assistants and Nurse Practitioners) who all work together to provide you with the care you need, when you need it. Your next appointment:   12 months The format for your next appointment:   In Person Provider:   You may see [name], MD or one of the following Advanced Practice Providers on your designated Care Team:    Richardson Dopp, PA-C  [place], [place]  Daune Perch, [place]

## 2019-09-18 ENCOUNTER — Ambulatory Visit: Payer: Medicare Other

## 2019-09-27 ENCOUNTER — Ambulatory Visit: Payer: Medicare Other

## 2019-09-27 ENCOUNTER — Ambulatory Visit: Payer: Medicare Other | Attending: Internal Medicine

## 2019-09-27 DIAGNOSIS — Z23 Encounter for immunization: Secondary | ICD-10-CM | POA: Insufficient documentation

## 2019-09-27 NOTE — Progress Notes (Signed)
   Covid-19 Vaccination Clinic  Name:  Jocelyn Day    MRN: [id number] DOB: 12/04/1940  09/27/2019  [name] was observed post Covid-19 immunization for 15 minutes without incidence. She was provided with Vaccine Information Sheet and instruction to access the V-Safe system.   [name] was instructed to call 911 with any severe reactions post vaccine: [name] Difficulty breathing  . Swelling of your face and throat  . A fast heartbeat  . A bad rash all over your body  . Dizziness and weakness    Immunizations Administered    Name Date Dose VIS Date Route   Pfizer COVID-19 Vaccine 09/27/2019  1:46 PM 0.3 mL 08/02/2019 Intramuscular   Manufacturer: [place]   Lot: [id number]   [place]: [id number]

## 2019-10-22 ENCOUNTER — Ambulatory Visit: Payer: Medicare Other | Attending: Internal Medicine

## 2019-10-22 DIAGNOSIS — Z23 Encounter for immunization: Secondary | ICD-10-CM | POA: Insufficient documentation

## 2019-10-22 NOTE — Progress Notes (Signed)
   Covid-19 Vaccination Clinic  Name:  Jocelyn Day    MRN: [id number] DOB: 06-23-1941  10/22/2019  [name] was observed post Covid-19 immunization for 15 minutes without incident. She was provided with Vaccine Information Sheet and instruction to access the V-Safe system.   [name] was instructed to call 911 with any severe reactions post vaccine: [name] Difficulty breathing  . Swelling of face and throat  . A fast heartbeat  . A bad rash all over body  . Dizziness and weakness   Immunizations Administered    Name Date Dose VIS Date Route   Pfizer COVID-19 Vaccine 10/22/2019  1:44 PM 0.3 mL 08/02/2019 Intramuscular   Manufacturer: [place]   Lot: [id number]   [place]: [id number]

## 2019-12-15 ENCOUNTER — Other Ambulatory Visit: Payer: Self-pay | Admitting: Hematology and Oncology

## 2020-02-14 ENCOUNTER — Ambulatory Visit: Payer: Medicare Other | Admitting: Hematology and Oncology

## 2020-02-18 ENCOUNTER — Encounter: Payer: Self-pay | Admitting: Obstetrics and Gynecology

## 2020-02-26 NOTE — Progress Notes (Signed)
Patient Care Team: Shon Baton, MD as PCP - General (Internal Medicine) Nahser, Wonda Cheng, MD as PCP - Cardiology (Cardiology) [name], MD as Consulting Physician (General Surgery) Nicholas Lose, MD as Consulting Physician (Hematology and Oncology) Gery Pray, MD as Consulting Physician (Radiation Oncology)  DIAGNOSIS:    ICD-10-CM   1. Malignant neoplasm of upper-outer quadrant of right breast in female, estrogen receptor positive ([place])  C50.411    Z17.0     SUMMARY OF ONCOLOGIC HISTORY: Oncology History  Breast cancer of upper-outer quadrant of right female breast ([place])  01/27/2016 Initial Diagnosis   Right breast biopsy: DCIS with calcifications low to intermediate grade, ER 100%, PR 80%, Tis N0 stage 0; screening mammogram revealed right breast calcifications 8 mm   03/08/2016 Surgery   Right lumpectomy (ingram): DCIS intermediate grade with calcifications, 0.9 cm, margins negative, ER 100%, PR 80%, Tis NX stage 0    Radiation Therapy   Refused XRT   06/22/2016 -  Anti-estrogen oral therapy   Tamoxifen 20mg  daily x 5 years     CHIEF COMPLIANT: Follow-up of right breast DCIS on tamoxifen   INTERVAL HISTORY: Jocelyn Day is a 79 y.o. with above-mentioned history of right breast DCIS treated with lumpectomy and is currently on antiestrogen therapy with tamoxifen. Mammogram on 02/15/19 showed no evidence of malignancy bilaterally. Bone density scan on 02/15/19 showed osteoporosis with a T-score of -2.6 at the left femoral neck. She presents to the clinic today for annual follow-up.  She is planning to move to [place] to stay closer to her family. ALLERGIES:  is allergic to no known allergies.  MEDICATIONS:  Current Outpatient Medications  Medication Sig Dispense Refill  . aspirin 81 MG tablet Take 81 mg by mouth daily.      [name] atorvastatin (LIPITOR) 20 MG tablet TAKE 1 TABLET BY MOUTH  DAILY 90 tablet 3  . carvedilol (COREG) 12.5 MG tablet TAKE 1 TABLET BY MOUTH TWO   TIMES DAILY WITH MEALS 180 tablet 3  . lisinopril (ZESTRIL) 5 MG tablet TAKE 1 TABLET BY MOUTH  DAILY 90 tablet 3  . Melatonin (CVS MELATONIN) 10 MG CAPS Take 1 capsule by mouth daily.    . Multiple Vitamins-Minerals (PRESERVISION AREDS 2 PO) Take 1 tablet by mouth 2 (two) times daily.    . tamoxifen (NOLVADEX) 20 MG tablet TAKE 1 TABLET BY MOUTH  DAILY 90 tablet 3  . VITAMIN D PO Take by mouth.     No current facility-administered medications for this visit.    PHYSICAL EXAMINATION: ECOG PERFORMANCE STATUS: 1 - Symptomatic but completely ambulatory  Vitals:   02/27/20 1146  BP: (!) 132/59  Pulse: 77  Resp: 18  Temp: 98.7 F (37.1 C)  SpO2: 99%   Filed Weights   02/27/20 1146  Weight: 130 lb 3.2 oz (59.1 kg)    BREAST: No palpable masses or nodules in either right or left breasts. No palpable axillary supraclavicular or infraclavicular adenopathy no breast tenderness or nipple discharge. (exam performed in the presence of a chaperone)  LABORATORY DATA:  I have reviewed the data as listed CMP Latest Ref Rng & Units 04/16/2018 04/17/2017 04/03/2017  Glucose 70 - 99 mg/dL 98 129(H) 109(H)  BUN 6 - 23 mg/dL 15 13 16   Creatinine 0.40 - 1.20 mg/dL 0.97 0.97 0.91  Sodium 135 - 145 mEq/L 138 140 136  Potassium 3.5 - 5.1 mEq/L 4.3 4.4 4.9  Chloride 96 - 112 mEq/L 101 101 101  CO2 19 -  32 mEq/L 30 26 28   Calcium 8.4 - 10.5 mg/dL 9.9 9.2 9.7  Total Protein 6.0 - 8.3 g/dL - - 6.9  Total Bilirubin 0.2 - 1.2 mg/dL - - 0.7  Alkaline Phos 39 - 117 U/L - - 39  AST 0 - 37 U/L - - 22  ALT 0 - 35 U/L - - 15    Lab Results  Component Value Date   WBC 8.6 04/03/2017   HGB 15.3 (H) 04/03/2017   HCT 45.6 04/03/2017   MCV 98.4 04/03/2017   PLT 272.0 04/03/2017   NEUTROABS 5.6 04/03/2017    ASSESSMENT & PLAN:  Breast cancer of upper-outer quadrant of right female breast ([place]) Breast cancer of upper-outer quadrant of right female breast ([place]) Right lumpectomy 03/08/2016: DCIS  intermediate grade with calcifications, 0.9 cm, margins negative, ER 100%, PR 80%, Tis NX stage 0 Refused adj XRT  Treatment Plan: Tamoxifen 20 mg daily x 5 yearsstarted 03/22/2016  Tamoxifen Toxicities: No side effects  Breast cancer surveillance: 1.Breast exam 02/26/2020: Benign  2.mammogram   02/15/2019 at [facility]: Benign breast density category B  Patient is planning to move to [facility] to stay closer to her family. She may decide not to follow-up next year with [place].  She will schedule mammogram in [place] for her annual checkups. Return to clinic in 1 year for follow-up     No orders of the defined types were placed in this encounter.  The patient has a good understanding of the overall plan. she agrees with it. she will call with any problems that may develop before the next visit here.  Total time spent: 20 mins including face to face time and time spent for planning, charting and coordination of care  Nicholas Lose, MD 02/27/2020  I, Cloyde Reams Dorshimer, am acting as scribe for Dr. Nicholas Lose.  I have reviewed the above documentation for accuracy and completeness, and I agree with the above.

## 2020-02-26 NOTE — Assessment & Plan Note (Signed)
Breast cancer of upper-outer quadrant of right female breast ([place]) Right lumpectomy 03/08/2016: DCIS intermediate grade with calcifications, 0.9 cm, margins negative, ER 100%, PR 80%, Tis NX stage 0 Refused adj XRT  Treatment Plan: Tamoxifen 20 mg daily x 5 yearsstarted 03/22/2016  Tamoxifen Toxicities: No side effects  Breast cancer surveillance: 1.Breast exam 02/26/2020: Benign  2.mammogram   02/15/2019 at [facility]: Benign breast density category B   Return to clinic in 1 year for follow-up

## 2020-02-27 ENCOUNTER — Other Ambulatory Visit: Payer: Self-pay

## 2020-02-27 ENCOUNTER — Inpatient Hospital Stay: Payer: Medicare Other | Attending: Hematology and Oncology | Admitting: Hematology and Oncology

## 2020-02-27 DIAGNOSIS — Z170 Estrogen receptor positive status [ER+]: Secondary | ICD-10-CM | POA: Diagnosis not present

## 2020-02-27 DIAGNOSIS — Z79810 Long term (current) use of selective estrogen receptor modulators (SERMs): Secondary | ICD-10-CM | POA: Insufficient documentation

## 2020-02-27 DIAGNOSIS — Z79899 Other long term (current) drug therapy: Secondary | ICD-10-CM | POA: Diagnosis not present

## 2020-02-27 DIAGNOSIS — C50411 Malignant neoplasm of upper-outer quadrant of right female breast: Secondary | ICD-10-CM | POA: Diagnosis present

## 2020-02-27 DIAGNOSIS — M810 Age-related osteoporosis without current pathological fracture: Secondary | ICD-10-CM | POA: Diagnosis not present

## 2020-02-27 MED ORDER — ZINC GLUCONATE 50 MG PO TABS: 50.0000 mg | ORAL_TABLET | Freq: Every day | ORAL | Status: AC

## 2020-02-27 MED ORDER — PRESERVISION AREDS PO CAPS: 1.0000 | ORAL_CAPSULE | Freq: Two times a day (BID) | ORAL | 0 refills | Status: AC

## 2020-04-23 ENCOUNTER — Encounter: Payer: Medicare Other | Admitting: Obstetrics and Gynecology

## 2020-05-31 ENCOUNTER — Other Ambulatory Visit: Payer: Self-pay | Admitting: Cardiovascular Disease

## 2020-06-17 ENCOUNTER — Ambulatory Visit: Payer: Medicare Other | Admitting: Cardiovascular Disease

## 2020-06-17 ENCOUNTER — Encounter: Payer: Self-pay | Admitting: Cardiovascular Disease

## 2020-06-17 ENCOUNTER — Other Ambulatory Visit: Payer: Self-pay

## 2020-06-17 VITALS — BP 116/70 | HR 82 | Ht 64.0 in | Wt 130.2 lb

## 2020-06-17 DIAGNOSIS — I10 Essential (primary) hypertension: Secondary | ICD-10-CM

## 2020-06-17 DIAGNOSIS — I249 Acute ischemic heart disease, unspecified: Secondary | ICD-10-CM | POA: Diagnosis not present

## 2020-06-17 NOTE — Progress Notes (Addendum)
Jocelyn Day Date of Birth  14-Jan-1941  Problem List: 1.  Takosubo Syndrome - Feb, 2012, EF = 30%, echo in April, 2012 revealed EF of 45-50%,   EF 60% in August 2013.    Prior notes:  Jocelyn Day is a 79 y.o.  with a hx of Takosubo Syndrome.  She has not had any recurrent chest pain.  Her husband died of esophageal cancer this past December. She is exercising some.    Jan 18, 2013:  She is doing well.    No CP. No dyspnea.    January 20, 2014:  Jocelyn Day is doing well.  Has had some shoulder problems.   February 17, 2015:  Jocelyn Day is doing well. No episodes of CP , no dyspnea , Had some nausea and dizziness during a Tai Chi work out  February 18, 2016:  Jocelyn Day is doing well from a cardiac standpont. Has been found to had breast -  DCIS .   Scheduled for lumpectomy.   No plans for chemo or XRT at this point .  Breathing is good .   Aug. 17, 2018:  Seen with daughter, Jocelyn Day  ( nurse anesthetist at [employer])   Has had salmonella poisoning  Had low potassium levels Was started on [place] . Is now eating better Diarrhea has resolved.   June 14, 2018: Doing well Had R cataeract surgery .   Seeing better. No cardiac issues.  Exercising well  Primary MD check cholesterol   Oct. 27, 2020  Jocelyn Day is seen today for follow up of her hx of Takotsubo syndrome History of right breast DCIS cancer.  She was treated with lumpectomy and is currently on tamoxifen.  She seems to be overall doing very well. Wt is 134 lbs.  Having some allergies   June 17, 2020: Jocelyn Day  is seen today for follow-up of her history of Takotsubo syndrome.  She has done extremely well on medical therapy.  She was diagnosed with right breast DCIS  cancer several years ago. She had radiation seed implant and lumpectomy. She was treated with tamoxifen. She has  lost 4 pounds since I saw her last year. Weight today is 130 pounds.  She has moved to [place],    Current Outpatient Medications on File Prior to  Visit  Medication Sig Dispense Refill  . aspirin 81 MG tablet Take 81 mg by mouth daily.      [name] atorvastatin (LIPITOR) 20 MG tablet TAKE 1 TABLET BY MOUTH  DAILY 90 tablet 3  . carvedilol (COREG) 12.5 MG tablet TAKE 1 TABLET BY MOUTH  TWICE DAILY WITH MEALS 180 tablet 3  . lisinopril (ZESTRIL) 5 MG tablet TAKE 1 TABLET BY MOUTH  DAILY 90 tablet 3  . Melatonin (CVS MELATONIN) 10 MG CAPS Take 1 capsule by mouth daily.    . Multiple Vitamins-Minerals (PRESERVISION AREDS) CAPS Take 1 capsule by mouth in the morning and at bedtime.  0  . tamoxifen (NOLVADEX) 20 MG tablet TAKE 1 TABLET BY MOUTH  DAILY 90 tablet 3  . VITAMIN D PO Take by mouth.    . zinc gluconate 50 MG tablet Take 1 tablet (50 mg total) by mouth daily.     No current facility-administered medications on file prior to visit.    Allergies  Allergen Reactions  . No Known Allergies     Past Medical History:  Diagnosis Date  . ACS (acute coronary syndrome) ([place]) 10/20/2010   Normal coronaries per cath  2/12  . Atrophic vaginitis   . Breast cancer of upper-outer quadrant of right female breast ([place]) 01/28/2016  . Cancer ([place])    skin cancer-basal cell  . Diverticulosis   . History of kidney stones   . Hypertension   . Insomnia   . Myocardial infarction ([place])    taksubo syndrome  . Osteoporosis 01/2019   T score -2.6 overall stable from prior DEXA  . Salmonella   . Takotsubo cardiomyopathy 10/21/2010   Acute coronary syndrome/Takotsubo cardiomyopathy    Past Surgical History:  Procedure Laterality Date  . basal cell carinoma excised  2005   face ,breast rt squamous cell 2016  . BREAST BIOPSY  01/27/2016   Malignant  . BREAST LUMPECTOMY WITH RADIOACTIVE SEED LOCALIZATION Right 03/08/2016   Procedure: RIGHT BREAST LUMPECTOMY WITH RADIOACTIVE SEED LOCALIZATION;  Surgeon: [name], MD;  Location: [facility];  Service: General;  Laterality: Right;  . CARDIAC CATHETERIZATION  10/21/2010   Normal coronary arteries -- Apical  ballooning syndrome consistent with tako-tsubo cardiomyopathy with an ejection fraction of 30%  . CESAREAN SECTION  1974  . [place]  . CHOLECYSTECTOMY  1993  . COLONOSCOPY    . DILATION AND CURETTAGE OF UTERUS  1990  . [place]  2010  . POLYPECTOMY      Social History   Tobacco Use  Smoking Status Never Smoker  Smokeless Tobacco Never Used    Social History   Substance and Sexual Activity  Alcohol Use Yes  . Alcohol/week: 0.0 standard drinks   Comment: once or twice  a month a glasses of wine    Family History  Problem Relation Age of Onset  . Hypertension Mother   . Prostate cancer Father   . Diabetes Brother        liver translplant and medication induced the diabetes  . Diabetes Paternal Uncle   . Diabetes Cousin   . Cervical cancer Sister   . Heart disease Neg Hx   . Colon cancer Neg Hx     Reviw of Systems:  Reviewed in the HPI.  All other systems are negative.  Physical Exam: Blood pressure 116/70, pulse 82, height 5' 4" (1.626 m), weight 130 lb 3.2 oz (59.1 kg), SpO2 95 %.  GEN:  Well nourished, well developed in no acute distress HEENT: Normal NECK: No JVD; No carotid bruits LYMPHATICS: No lymphadenopathy CARDIAC: RRR , no murmurs, rubs, gallops RESPIRATORY:  Clear to auscultation without rales, wheezing or rhonchi  ABDOMEN: Soft, non-tender, non-distended MUSCULOSKELETAL:  No edema; No deformity  SKIN: Warm and dry NEUROLOGIC:  Alert and oriented x 3   ECG: June 17, 2020: Normal sinus rhythm at 82. No ST or T wave changes.  Assessment / Plan:   1.  Takosubo Syndrome - Feb, 2012, EF = 30%, echo in April, 2012 revealed EF of 45-50%,  EF is now 60% by echo She is not had any signs or symptoms of recurrent heart failure.  I think that she should continue with her same medications.  She has moved to [place] and will likely be estabilshing down there.  Cont current meds If she cannot find a cardiologist within the  year,  I would by happy to see her back to refill her meds.        Jocelyn Moores, MD  06/17/2020 2:37 PM    [name],  [place], [place]  [address] Phone: [phone]; Fax: [phone]     

## 2020-06-17 NOTE — Patient Instructions (Addendum)
Look up Dr. Courtney Heys at [facility].  [phone]   Medication Instructions:  NO CHANGES *If you need a refill on your cardiac medications before your next appointment, please call your pharmacy*   Lab Work: NONE If you have labs (blood work) drawn today and your tests are completely normal, you will receive your results only by: [name] MyChart Message (if you have MyChart) OR . A paper copy in the mail If you have any lab test that is abnormal or we need to change your treatment, we will call you to review the results.   Testing/Procedures: NONE   Follow-Up: At [facility], you and your health needs are our priority.  As part of our continuing mission to provide you with exceptional heart care, we have created designated Provider Care Teams.  These Care Teams include your primary Cardiologist (physician) and Advanced Practice Providers (APPs -  Physician Assistants and Nurse Practitioners) who all work together to provide you with the care you need, when you need it.  We recommend signing up for the patient portal called "MyChart".  Sign up information is provided on this After Visit Summary.  MyChart is used to connect with patients for Virtual Visits (Telemedicine).  Patients are able to view lab/test results, encounter notes, upcoming appointments, etc.  Non-urgent messages can be sent to your provider as well.   To learn more about what you can do with MyChart, go to [email or web address].    Your next appointment:   1 year(s)  The format for your next appointment:   In Person  Provider:   [name], MD   Other Instructions

## 2020-06-19 ENCOUNTER — Telehealth: Payer: Self-pay | Admitting: Cardiovascular Disease

## 2020-06-19 NOTE — Telephone Encounter (Signed)
    Pt would like to speak with Dr. Elmarie Shiley nurse. She said there's an error on her medical records that she wants to correct

## 2020-06-19 NOTE — Telephone Encounter (Signed)
Spoke with the patient who states that she was looking at Dr. Elmarie Shiley OV note from 10/27 and she noticed that it says in 2020 she weighted 143lbs and note states that she has lost 13lbs since last year. Patient weighed 134lbs in 2020 and 130lbs 2021. She would like this to be corrected.

## 2020-11-16 ENCOUNTER — Other Ambulatory Visit: Payer: Self-pay | Admitting: Hematology and Oncology

## 2021-01-28 ENCOUNTER — Other Ambulatory Visit: Payer: Self-pay | Admitting: Hematology and Oncology

## 2021-03-03 NOTE — Progress Notes (Signed)
Patient Care Team: Shon Baton, MD as PCP - General (Internal Medicine) Nahser, Wonda Cheng, MD as PCP - Cardiology (Cardiology) [name], MD as Consulting Physician (General Surgery) Nicholas Lose, MD as Consulting Physician (Hematology and Oncology) Gery Pray, MD as Consulting Physician (Radiation Oncology)  DIAGNOSIS:    ICD-10-CM   1. Malignant neoplasm of upper-outer quadrant of right breast in female, estrogen receptor positive ([place])  C50.411    Z17.0       SUMMARY OF ONCOLOGIC HISTORY: Oncology History  Breast cancer of upper-outer quadrant of right female breast ([place])  01/27/2016 Initial Diagnosis   Right breast biopsy: DCIS with calcifications low to intermediate grade, ER 100%, PR 80%, Tis N0 stage 0; screening mammogram revealed right breast calcifications 8 mm   03/08/2016 Surgery   Right lumpectomy (ingram): DCIS intermediate grade with calcifications, 0.9 cm, margins negative, ER 100%, PR 80%, Tis NX stage 0    Radiation Therapy   Refused XRT    06/22/2016 -  Anti-estrogen oral therapy   Tamoxifen 20mg  daily x 5 years      CHIEF COMPLIANT: Follow-up of right breast DCIS on tamoxifen   INTERVAL HISTORY: Jocelyn Day is a 80 y.o. with above-mentioned history of right breast DCIS treated with lumpectomy and is currently on antiestrogen therapy with tamoxifen. Mammogram on 02/18/20 showed no evidence of malignancy bilaterally. She presents to the clinic today for annual follow-up.  Patient has moved to [place] to be closer to her family.  She came in yesterday to get her mammograms at [facility].  She is here today to discuss surveillance.  She completed 5 years of tamoxifen therapy.  ALLERGIES:  is allergic to no known allergies.  MEDICATIONS:  Current Outpatient Medications  Medication Sig Dispense Refill   aspirin 81 MG tablet Take 81 mg by mouth daily.       atorvastatin (LIPITOR) 20 MG tablet TAKE 1 TABLET BY MOUTH  DAILY 90 tablet 3   carvedilol (COREG)  12.5 MG tablet TAKE 1 TABLET BY MOUTH  TWICE DAILY WITH MEALS 180 tablet 3   lisinopril (ZESTRIL) 5 MG tablet TAKE 1 TABLET BY MOUTH  DAILY 90 tablet 3   Melatonin ([employer] MELATONIN) 10 MG CAPS Take 1 capsule by mouth daily.     Multiple Vitamins-Minerals (PRESERVISION AREDS) CAPS Take 1 capsule by mouth in the morning and at bedtime.  0   tamoxifen (NOLVADEX) 20 MG tablet TAKE 1 TABLET BY MOUTH  DAILY 90 tablet 3   VITAMIN D PO Take by mouth.     zinc gluconate 50 MG tablet Take 1 tablet (50 mg total) by mouth daily.     No current facility-administered medications for this visit.    PHYSICAL EXAMINATION: ECOG PERFORMANCE STATUS: 1 - Symptomatic but completely ambulatory  Vitals:   03/04/21 1344  BP: (!) 148/64  Pulse: 97  Resp: 18  Temp: 97.8 F (36.6 C)  SpO2: 97%   Filed Weights   03/04/21 1344  Weight: 136 lb 4.8 oz (61.8 kg)    BREAST: No palpable masses or nodules in either right or left breasts. No palpable axillary supraclavicular or infraclavicular adenopathy no breast tenderness or nipple discharge. (exam performed in the presence of a chaperone)  LABORATORY DATA:  I have reviewed the data as listed CMP Latest Ref Rng & Units 04/16/2018 04/17/2017 04/03/2017  Glucose 70 - 99 mg/dL 98 129(H) 109(H)  BUN 6 - 23 mg/dL 15 13 16   Creatinine 0.40 - 1.20 mg/dL 0.97 0.97  0.91  Sodium 135 - 145 mEq/L 138 140 136  Potassium 3.5 - 5.1 mEq/L 4.3 4.4 4.9  Chloride 96 - 112 mEq/L 101 101 101  CO2 19 - 32 mEq/L 30 26 28   Calcium 8.4 - 10.5 mg/dL 9.9 9.2 9.7  Total Protein 6.0 - 8.3 g/dL - - 6.9  Total Bilirubin 0.2 - 1.2 mg/dL - - 0.7  Alkaline Phos 39 - 117 U/L - - 39  AST 0 - 37 U/L - - 22  ALT 0 - 35 U/L - - 15    Lab Results  Component Value Date   WBC 8.6 04/03/2017   HGB 15.3 (H) 04/03/2017   HCT 45.6 04/03/2017   MCV 98.4 04/03/2017   PLT 272.0 04/03/2017   NEUTROABS 5.6 04/03/2017    ASSESSMENT & PLAN:  Breast cancer of upper-outer quadrant of right female  breast ([place]) Right lumpectomy 03/08/2016: DCIS intermediate grade with calcifications, 0.9 cm, margins negative, ER 100%, PR 80%, Tis NX stage 0 Refused adj XRT   Treatment Plan: Tamoxifen 20 mg daily x 5 years started 03/22/2016 Since she completed 5 years of therapy we can discontinue tamoxifen.    Tamoxifen Toxicities: No side effects   Patient moved to [facility] to stay closer to her family.  Breast cancer surveillance: 1. Mammogram 03/03/2021: Benign at [facility] breast density category B 2. Breast exam 03/04/2021: Benign She will get her mammograms in [place] in the future.  Return to clinic on an as-needed basis.    No orders of the defined types were placed in this encounter.  The patient has a good understanding of the overall plan. she agrees with it. she will call with any problems that may develop before the next visit here.  Total time spent: 20 mins including face to face time and time spent for planning, charting and coordination of care  Rulon Eisenmenger, MD, MPH 03/04/2021  I, Thana Ates, am acting as scribe for Dr. Nicholas Lose.  I have reviewed the above documentation for accuracy and completeness, and I agree with the above.

## 2021-03-04 ENCOUNTER — Other Ambulatory Visit: Payer: Self-pay

## 2021-03-04 ENCOUNTER — Inpatient Hospital Stay: Payer: Medicare Other | Attending: Hematology and Oncology | Admitting: Hematology and Oncology

## 2021-03-04 DIAGNOSIS — C50411 Malignant neoplasm of upper-outer quadrant of right female breast: Secondary | ICD-10-CM | POA: Diagnosis present

## 2021-03-04 DIAGNOSIS — Z79810 Long term (current) use of selective estrogen receptor modulators (SERMs): Secondary | ICD-10-CM | POA: Insufficient documentation

## 2021-03-04 DIAGNOSIS — Z170 Estrogen receptor positive status [ER+]: Secondary | ICD-10-CM | POA: Insufficient documentation

## 2021-03-04 DIAGNOSIS — Z79899 Other long term (current) drug therapy: Secondary | ICD-10-CM | POA: Insufficient documentation

## 2021-03-04 NOTE — Assessment & Plan Note (Signed)
Right lumpectomy 03/08/2016: DCIS intermediate grade with calcifications, 0.9 cm, margins negative, ER 100%, PR 80%, Tis NX stage 0 Refused adj XRT  Treatment Plan: Tamoxifen 20 mg daily x 5 yearsstarted8/08/2015 Since she completed 5 years of therapy we can discontinue tamoxifen.   Tamoxifen Toxicities: No side effects  Patient is planning to move to [facility] to stay closer to her family.  Breast cancer surveillance: 1. Mammogram 02/18/2020: Benign at [facility] breast density category B 2. Breast exam 03/04/2021:  Return to clinic in 1 year for follow-up
